# Patient Record
Sex: Female | Born: 1955 | Race: Black or African American | Hispanic: No | Marital: Single | Smoking: Former smoker
Health system: Southern US, Community
[De-identification: ages and names within clinical notes are randomized; demographics above are authoritative.]

## PROBLEM LIST (undated history)

## (undated) DIAGNOSIS — G4730 Sleep apnea, unspecified: Secondary | ICD-10-CM

## (undated) DIAGNOSIS — E119 Type 2 diabetes mellitus without complications: Secondary | ICD-10-CM

## (undated) DIAGNOSIS — K219 Gastro-esophageal reflux disease without esophagitis: Secondary | ICD-10-CM

## (undated) DIAGNOSIS — R921 Mammographic calcification found on diagnostic imaging of breast: Secondary | ICD-10-CM

## (undated) DIAGNOSIS — N189 Chronic kidney disease, unspecified: Secondary | ICD-10-CM

## (undated) DIAGNOSIS — I10 Essential (primary) hypertension: Secondary | ICD-10-CM

## (undated) DIAGNOSIS — T7840XA Allergy, unspecified, initial encounter: Secondary | ICD-10-CM

## (undated) DIAGNOSIS — E785 Hyperlipidemia, unspecified: Secondary | ICD-10-CM

## (undated) DIAGNOSIS — R519 Headache, unspecified: Secondary | ICD-10-CM

## (undated) DIAGNOSIS — R51 Headache: Secondary | ICD-10-CM

## (undated) HISTORY — DX: Sleep apnea, unspecified: G47.30

## (undated) HISTORY — PX: OTHER SURGICAL HISTORY: SHX169

## (undated) HISTORY — DX: Allergy, unspecified, initial encounter: T78.40XA

## (undated) HISTORY — DX: Chronic kidney disease, unspecified: N18.9

## (undated) HISTORY — DX: Headache: R51

## (undated) HISTORY — DX: Type 2 diabetes mellitus without complications: E11.9

## (undated) HISTORY — PX: ENDOMETRIAL ABLATION: SHX621

## (undated) HISTORY — DX: Headache, unspecified: R51.9

## (undated) HISTORY — PX: COLONOSCOPY: SHX174

## (undated) HISTORY — DX: Hyperlipidemia, unspecified: E78.5

## (undated) HISTORY — PX: TUBAL LIGATION: SHX77

## (undated) HISTORY — PX: BREAST SURGERY: SHX581

---

## 1999-07-07 ENCOUNTER — Other Ambulatory Visit: Admission: RE | Admit: 1999-07-07 | Discharge: 1999-07-07 | Payer: Self-pay | Admitting: Family Medicine

## 2001-03-22 ENCOUNTER — Other Ambulatory Visit: Admission: RE | Admit: 2001-03-22 | Discharge: 2001-03-22 | Payer: Self-pay | Admitting: Family Medicine

## 2002-09-19 ENCOUNTER — Other Ambulatory Visit: Admission: RE | Admit: 2002-09-19 | Discharge: 2002-09-19 | Payer: Self-pay | Admitting: Family Medicine

## 2006-07-22 ENCOUNTER — Ambulatory Visit: Payer: Self-pay | Admitting: Family Medicine

## 2006-07-23 ENCOUNTER — Ambulatory Visit: Payer: Self-pay | Admitting: Family Medicine

## 2006-07-23 LAB — CONVERTED CEMR LAB
Albumin: 3.2 g/dL — ABNORMAL LOW (ref 3.5–5.2)
Basophils Absolute: 0.1 10*3/uL (ref 0.0–0.1)
Bilirubin, Direct: 0.1 mg/dL (ref 0.0–0.3)
Cholesterol: 183 mg/dL (ref 0–200)
Eosinophils Absolute: 0.2 10*3/uL (ref 0.0–0.6)
GFR calc non Af Amer: 62 mL/min
Glucose, Bld: 117 mg/dL — ABNORMAL HIGH (ref 70–99)
HCT: 27.4 % — ABNORMAL LOW (ref 36.0–46.0)
Hemoglobin: 8.6 g/dL — ABNORMAL LOW (ref 12.0–15.0)
Hgb A1c MFr Bld: 6.7 % — ABNORMAL HIGH (ref 4.6–6.0)
Lymphocytes Relative: 22.8 % (ref 12.0–46.0)
MCHC: 31.2 g/dL (ref 30.0–36.0)
MCV: 62.8 fL — ABNORMAL LOW (ref 78.0–100.0)
Monocytes Absolute: 0.3 10*3/uL (ref 0.2–0.7)
Neutrophils Relative %: 69.3 % (ref 43.0–77.0)
Potassium: 3 meq/L — ABNORMAL LOW (ref 3.5–5.1)
Sodium: 137 meq/L (ref 135–145)
TSH: 1.86 microintl units/mL (ref 0.35–5.50)
Total Bilirubin: 0.4 mg/dL (ref 0.3–1.2)

## 2006-07-27 ENCOUNTER — Other Ambulatory Visit: Admission: RE | Admit: 2006-07-27 | Discharge: 2006-07-27 | Payer: Self-pay | Admitting: Family Medicine

## 2006-07-27 ENCOUNTER — Ambulatory Visit: Payer: Self-pay | Admitting: Family Medicine

## 2006-07-27 ENCOUNTER — Encounter: Payer: Self-pay | Admitting: Family Medicine

## 2006-09-14 ENCOUNTER — Ambulatory Visit: Payer: Self-pay | Admitting: Family Medicine

## 2006-10-14 ENCOUNTER — Ambulatory Visit: Payer: Self-pay | Admitting: Family Medicine

## 2006-12-20 DIAGNOSIS — I10 Essential (primary) hypertension: Secondary | ICD-10-CM

## 2007-01-10 ENCOUNTER — Ambulatory Visit: Payer: Self-pay | Admitting: Family Medicine

## 2007-01-10 DIAGNOSIS — D509 Iron deficiency anemia, unspecified: Secondary | ICD-10-CM | POA: Insufficient documentation

## 2007-01-10 DIAGNOSIS — N949 Unspecified condition associated with female genital organs and menstrual cycle: Secondary | ICD-10-CM

## 2007-03-25 ENCOUNTER — Ambulatory Visit: Payer: Self-pay | Admitting: Family Medicine

## 2007-03-25 LAB — CONVERTED CEMR LAB
Eosinophils Absolute: 0.2 10*3/uL (ref 0.0–0.6)
Eosinophils Relative: 3.1 % (ref 0.0–5.0)
Folate: 6.6 ng/mL
Hemoglobin: 10.6 g/dL
Iron: 102 ug/dL (ref 42–145)
MCV: 83.5 fL (ref 78.0–100.0)
Monocytes Relative: 6.6 % (ref 3.0–11.0)
Neutro Abs: 3.4 10*3/uL (ref 1.4–7.7)
Platelets: 285 10*3/uL (ref 150–400)
RBC: 4.45 M/uL (ref 3.87–5.11)
Vitamin B-12: 408 pg/mL (ref 211–911)
WBC: 5.8 10*3/uL (ref 4.5–10.5)

## 2008-12-03 ENCOUNTER — Ambulatory Visit: Payer: Self-pay | Admitting: Gastroenterology

## 2008-12-21 ENCOUNTER — Ambulatory Visit: Payer: Self-pay | Admitting: Gastroenterology

## 2012-02-18 ENCOUNTER — Encounter: Payer: Self-pay | Admitting: Family Medicine

## 2012-02-18 DIAGNOSIS — N189 Chronic kidney disease, unspecified: Secondary | ICD-10-CM | POA: Insufficient documentation

## 2012-09-26 ENCOUNTER — Other Ambulatory Visit: Payer: Self-pay | Admitting: Dermatology

## 2013-09-20 ENCOUNTER — Encounter: Payer: Self-pay | Admitting: Family Medicine

## 2013-09-20 ENCOUNTER — Ambulatory Visit (INDEPENDENT_AMBULATORY_CARE_PROVIDER_SITE_OTHER): Payer: 59 | Admitting: Family Medicine

## 2013-09-20 VITALS — BP 128/64 | HR 73 | Temp 98.3°F | Resp 16 | Ht 63.25 in | Wt 184.0 lb

## 2013-09-20 DIAGNOSIS — I10 Essential (primary) hypertension: Secondary | ICD-10-CM

## 2013-09-20 DIAGNOSIS — N898 Other specified noninflammatory disorders of vagina: Secondary | ICD-10-CM

## 2013-09-20 DIAGNOSIS — G4700 Insomnia, unspecified: Secondary | ICD-10-CM

## 2013-09-20 LAB — POCT URINALYSIS DIPSTICK
BILIRUBIN UA: NEGATIVE
GLUCOSE UA: 100
Ketones, UA: NEGATIVE
Nitrite, UA: NEGATIVE
Protein, UA: NEGATIVE
SPEC GRAV UA: 1.01
Urobilinogen, UA: 0.2
pH, UA: 5.5

## 2013-09-20 LAB — POCT UA - MICROSCOPIC ONLY
Casts, Ur, LPF, POC: NEGATIVE
Crystals, Ur, HPF, POC: NEGATIVE
MUCUS UA: NEGATIVE
Yeast, UA: NEGATIVE

## 2013-09-20 LAB — POCT WET PREP WITH KOH
KOH Prep POC: POSITIVE
RBC Wet Prep HPF POC: NEGATIVE
TRICHOMONAS UA: NEGATIVE
Yeast Wet Prep HPF POC: NEGATIVE

## 2013-09-20 MED ORDER — TRAZODONE HCL 50 MG PO TABS: 25.0000 mg | ORAL_TABLET | Freq: Every evening | ORAL | Status: DC | PRN

## 2013-09-20 MED ORDER — METRONIDAZOLE 500 MG PO TABS: 500.0000 mg | ORAL_TABLET | Freq: Two times a day (BID) | ORAL | Status: DC

## 2013-09-20 MED ORDER — FLUCONAZOLE 150 MG PO TABS: 150.0000 mg | ORAL_TABLET | Freq: Once | ORAL | Status: DC

## 2013-09-20 NOTE — Progress Notes (Addendum)
Subjective:    Patient ID: Jane Pena, female    DOB: 25-Sep-1955, 58 y.o.   MRN: [id number]  HPI  This 58 y.o. AA female is new to [facility], last seen here > 3 years ago. She has HTN, followed by [name] annually at [facility]. She reports occasional lightheadedness when she gets up too fast. She has no other symptoms and exercises regularly.  Pt c/o insomnia for several years. She has no trouble going to sleep but awakens after 3-4 hours and cannot esily return to sleep. Ambien has adverse effects. She has not tried any OTC products. Limits caffeine intake (coffee) to morning hours. She lives w/ an elderly female. Bedtime routine: crocheting, reading or TV. Temperature in bedroom is comfortable.  Pt c/o vag discharge that itches; she has not noticed an odor. She is sexually active x 3 months after abstinence for > 1 year. GYN exam is due next months (Dr. Matthew Saras).  Patient Active Problem List   Diagnosis Date Noted  . Chronic kidney disease 02/18/2012  . ANEMIA-IRON DEFICIENCY 01/10/2007  . 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 HYPERTENSION 12/20/2006   PMHx, Surg Hx, Soc Hx and Fam Hx reviewed.   Prescription Medication: Amlodipine- valsartan 10-320 MG tablet  1 tab daily.   Review of Systems  Constitutional: Negative.   Eyes: Negative for visual disturbance.  Respiratory: Negative for cough, chest tightness and shortness of breath.   Cardiovascular: Negative for chest pain, palpitations and leg swelling.  Endocrine: Negative.   Genitourinary: Negative for difficulty urinating, genital sores, vaginal pain, pelvic pain and dyspareunia.  Neurological: Negative.   Psychiatric/Behavioral: Positive for sleep disturbance. Negative for confusion, dysphoric mood and agitation. The patient is not nervous/anxious.        Objective:   Physical Exam  Nursing note and vitals reviewed. Constitutional: She is oriented to person, place, and time. She appears  well-developed and well-nourished. No distress.  HENT:  Head: Normocephalic and atraumatic.  Right Ear: External ear normal.  Left Ear: External ear normal.  Nose: Nose normal.  Mouth/Throat: Oropharynx is clear and moist. No oropharyngeal exudate.  Eyes: Conjunctivae and EOM are normal. Pupils are equal, round, and reactive to light. No scleral icterus.  Neck: Normal range of motion. Neck supple.  Cardiovascular: Normal rate, regular rhythm and normal heart sounds.  Exam reveals no gallop and no friction rub.   No murmur heard. Pulmonary/Chest: Effort normal and breath sounds normal. No respiratory distress.  Abdominal: Soft. She exhibits no distension and no mass. There is no tenderness. There is no guarding.  Genitourinary: There is no rash, tenderness or lesion on the right labia. There is no rash, tenderness or lesion on the left labia. Cervix exhibits discharge. Cervix exhibits no friability. There is erythema around the vagina. No tenderness or bleeding around the vagina. Vaginal discharge found.  Musculoskeletal: Normal range of motion. She exhibits no edema and no tenderness.  Neurological: She is alert and oriented to person, place, and time. No cranial nerve deficit. Coordination normal.  Skin: Skin is warm and dry. She is not diaphoretic.  Psychiatric: She has a normal mood and affect. Her behavior is normal. Judgment and thought content normal.    Results for orders placed in visit on 09/20/13  POCT UA - MICROSCOPIC ONLY      Result Value Ref Range   WBC, Ur, HPF, POC tntc     RBC, urine, microscopic 5-7     Bacteria, U Microscopic 2+  Mucus, UA neg     Epithelial cells, urine per micros 3-6     Crystals, Ur, HPF, POC neg     Casts, Ur, LPF, POC neg     Yeast, UA neg    POCT URINALYSIS DIPSTICK      Result Value Ref Range   Color, UA yellow     Clarity, UA clear     Glucose, UA 100     Bilirubin, UA neg     Ketones, UA neg     Spec Grav, UA 1.010     Blood, UA  trace     pH, UA 5.5     Protein, UA neg     Urobilinogen, UA 0.2     Nitrite, UA neg     Leukocytes, UA moderate (2+)    POCT WET PREP WITH KOH      Result Value Ref Range   Trichomonas, UA Negative     Clue Cells Wet Prep HPF POC tntc     Epithelial Wet Prep HPF POC tntc     Yeast Wet Prep HPF POC neg     Bacteria Wet Prep HPF POC 4+     RBC Wet Prep HPF POC neg     WBC Wet Prep HPF POC tntc     KOH Prep POC Positive         Assessment & Plan:  Discharge of vagina - Plan: POCT UA - Microscopic Only, POCT urinalysis dipstick, POCT Wet Prep with KOH  Insomnia - Trial Trazodone 50 mg 1/2 tablet hs for 2 weeks; if not sleeping through the night, increase to 1 tablet hs.          Plan: Thyroid Panel With TSH  HTN (hypertension) - Plan: Thyroid Panel With TSH, Vitamin D, 25-hydroxy  Meds ordered this encounter  Medications  . traZODone (DESYREL) 50 MG tablet    Sig: Take 0.5-1 tablets (25-50 mg total) by mouth at bedtime as needed for sleep.    Dispense:  30 tablet    Refill:  3  . metroNIDAZOLE (FLAGYL) 500 MG tablet    Sig: Take 1 tablet (500 mg total) by mouth 2 (two) times daily.    Dispense:  14 tablet    Refill:  0  . fluconazole (DIFLUCAN) 150 MG tablet    Sig: Take 1 tablet (150 mg total) by mouth once.    Dispense:  1 tablet    Refill:  0

## 2013-09-20 NOTE — Patient Instructions (Addendum)
Insomnia Insomnia is frequent trouble falling and/or staying asleep. Insomnia can be a long term problem or a short term problem. Both are common. Insomnia can be a short term problem when the wakefulness is related to a certain stress or worry. Long term insomnia is often related to ongoing stress during waking hours and/or poor sleeping habits. Overtime, sleep deprivation itself can make the problem worse. Every little thing feels more severe because you are overtired and your ability to cope is decreased. CAUSES   Stress, anxiety, and depression.  Poor sleeping habits.  Distractions such as TV in the bedroom.  Naps close to bedtime.  Engaging in emotionally charged conversations before bed.  Technical reading before sleep.  Alcohol and other sedatives. They may make the problem worse. They can hurt normal sleep patterns and normal dream activity.  Stimulants such as caffeine for several hours prior to bedtime.  Pain syndromes and shortness of breath can cause insomnia.  Exercise late at night.  Changing time zones may cause sleeping problems (jet lag). It is sometimes helpful to have someone observe your sleeping patterns. They should look for periods of not breathing during the night (sleep apnea). They should also look to see how long those periods last. If you live alone or observers are uncertain, you can also be observed at a sleep clinic where your sleep patterns will be professionally monitored. Sleep apnea requires a checkup and treatment. Give your caregivers your medical history. Give your caregivers observations your family has made about your sleep.  SYMPTOMS   Not feeling rested in the morning.  Anxiety and restlessness at bedtime.  Difficulty falling and staying asleep. TREATMENT   Your caregiver may prescribe treatment for an underlying medical disorders. Your caregiver can give advice or help if you are using alcohol or other drugs for self-medication. Treatment  of underlying problems will usually eliminate insomnia problems.  Medications can be prescribed for short time use. They are generally not recommended for lengthy use.  Over-the-counter sleep medicines are not recommended for lengthy use. They can be habit forming.  You can promote easier sleeping by making lifestyle changes such as:  Using relaxation techniques that help with breathing and reduce muscle tension.  Exercising earlier in the day.  Changing your diet and the time of your last meal. No night time snacks.  Establish a regular time to go to bed.  Counseling can help with stressful problems and worry.  Soothing music and white noise may be helpful if there are background noises you cannot remove.  Stop tedious detailed work at least one hour before bedtime. HOME CARE INSTRUCTIONS   Keep a diary. Inform your caregiver about your progress. This includes any medication side effects. See your caregiver regularly. Take note of:  Times when you are asleep.  Times when you are awake during the night.  The quality of your sleep.  How you feel the next day. This information will help your caregiver care for you.  Get out of bed if you are still awake after 15 minutes. Read or do some quiet activity. Keep the lights down. Wait until you feel sleepy and go back to bed.  Keep regular sleeping and waking hours. Avoid naps.  Exercise regularly.  Avoid distractions at bedtime. Distractions include watching television or engaging in any intense or detailed activity like attempting to balance the household checkbook.  Develop a bedtime ritual. Keep a familiar routine of bathing, brushing your teeth, climbing into bed at the same   time each night, listening to soothing music. Routines increase the success of falling to sleep faster.  Use relaxation techniques. This can be using breathing and muscle tension release routines. It can also include visualizing peaceful scenes. You can  also help control troubling or intruding thoughts by keeping your mind occupied with boring or repetitive thoughts like the old concept of counting sheep. You can make it more creative like imagining planting one beautiful flower after another in your backyard garden.  During your day, work to eliminate stress. When this is not possible use some of the previous suggestions to help reduce the anxiety that accompanies stressful situations. MAKE SURE YOU:   Understand these instructions.  Will watch your condition.  Will get help right away if you are not doing well or get worse. Document Released: 05/01/2000 Document Revised: 07/27/2011 Document Reviewed: 06/01/2007 [facility] Patient Information 2014 [place].  I have prescribed Trazodone 50 mg tablet for sleep; take 1/2 tablet at bedtime for 1 week then increase to 1 tablet if you are not sleeping through the night.    Achilles Tendinitis Achilles tendinitis is inflammation of the tough, cord-like band that attaches the lower muscles of your leg to your heel (Achilles tendon). It is usually caused by overusing the tendon and joint involved.  CAUSES Achilles tendinitis can happen because of:  A sudden increase in exercise or activity (such as running).  Doing the same exercises or activities (such as jumping) over and over.  Not warming up calf muscles before exercising.  Exercising in shoes that are worn out or not made for exercise.  Having arthritis or a bone growth on the back of the heel bone. This can rub against the tendon and hurt the tendon. SIGNS AND SYMPTOMS The most common symptoms are:  Pain in the back of the leg, just above the heel. The pain usually gets worse with exercise and better with rest.  Stiffness or soreness in the back of the leg, especially in the morning.  Swelling of the skin over the Achilles tendon.  Trouble standing on tiptoe. Sometimes, an Achilles tendon tears (ruptures). Symptoms of an  Achilles tendon rupture can include:  Sudden, severe pain in the back of the leg.  Trouble putting weight on the foot or walking normally. DIAGNOSIS Achilles tendinitis will be diagnosed based on symptoms and a physical examination. An X-ray may be done to check if another condition is causing your symptoms. An MRI may be ordered if your health care provider suspects you may have completely torn your tendon, which is called an Achilles tendon rupture.  TREATMENT  Achilles tendinitis usually gets better over time. It can take weeks to months to heal completely. Treatment focuses on treating the symptoms and helping the injury heal. HOME CARE INSTRUCTIONS   Rest your Achilles tendon and avoid activities that cause pain.  Apply ice to the injured area:  Put ice in a plastic bag.  Place a towel between your skin and the bag.  Leave the ice on for 20 minutes, 2 3 times a day  Try to avoid using the tendon (other than gentle range of motion) while the tendon is painful. Do not resume use until instructed by your health care provider. Then begin use gradually. Do not increase use to the point of pain. If pain does develop, decrease use and continue the above measures. Gradually increase activities that do not cause discomfort until you achieve normal use.  Do exercises to make your calf  muscles stronger and more flexible. Your health care provider or physical therapist can recommend exercises for you to do.  Wrap your ankle with an elastic bandage or other wrap. This can help keep your tendon from moving too much. Your health care provider will show you how to wrap your ankle correctly.  Only take over-the-counter or prescription medicines for pain, discomfort, or fever as directed by your health care provider. SEEK MEDICAL CARE IF:   Your pain and swelling increase or pain is uncontrolled with medicines.  You develop new, unexplained symptoms or your symptoms get worse.  You are unable to  move your toes or foot.  You develop warmth and swelling in your foot.  You have an unexplained temperature. MAKE SURE YOU:   Understand these instructions.  Will watch your condition.  Will get help right away if you are not doing well or get worse. Document Released: 02/11/2005 Document Revised: 02/22/2013 Document Reviewed: 12/14/2012 [facility] Patient Information 2014 [place].    Bacterial Vaginosis Bacterial vaginosis is a vaginal infection that occurs when the normal balance of bacteria in the vagina is disrupted. It results from an overgrowth of certain bacteria. This is the most common vaginal infection in women of childbearing age. Treatment is important to prevent complications, especially in pregnant women, as it can cause a premature delivery. CAUSES  Bacterial vaginosis is caused by an increase in harmful bacteria that are normally present in smaller amounts in the vagina. Several different kinds of bacteria can cause bacterial vaginosis. However, the reason that the condition develops is not fully understood. RISK FACTORS Certain activities or behaviors can put you at an increased risk of developing bacterial vaginosis, including:  Having a new sex partner or multiple sex partners.  Douching.  Using an intrauterine device (IUD) for contraception. Women do not get bacterial vaginosis from toilet seats, bedding, swimming pools, or contact with objects around them. SIGNS AND SYMPTOMS  Some women with bacterial vaginosis have no signs or symptoms. Common symptoms include:  Grey vaginal discharge.  A fishlike odor with discharge, especially after sexual intercourse.  Itching or burning of the vagina and vulva.  Burning or pain with urination. DIAGNOSIS  Your health care provider will take a medical history and examine the vagina for signs of bacterial vaginosis. A sample of vaginal fluid may be taken. Your health care provider will look at this sample under a  microscope to check for bacteria and abnormal cells. A vaginal pH test may also be done.  TREATMENT  Bacterial vaginosis may be treated with antibiotic medicines. These may be given in the form of a pill or a vaginal cream. A second round of antibiotics may be prescribed if the condition comes back after treatment.  HOME CARE INSTRUCTIONS   Only take over-the-counter or prescription medicines as directed by your health care provider.  If antibiotic medicine was prescribed, take it as directed. Make sure you finish it even if you start to feel better.  Do not have sex until treatment is completed.  Tell all sexual partners that you have a vaginal infection. They should see their health care provider and be treated if they have problems, such as a mild rash or itching.  Practice safe sex by using condoms and only having one sex partner. SEEK MEDICAL CARE IF:   Your symptoms are not improving after 3 days of treatment.  You have increased discharge or pain.  You have a fever. MAKE SURE YOU:   Understand these  instructions.  Will watch your condition.  Will get help right away if you are not doing well or get worse. FOR MORE INFORMATION  Centers for Disease Control and Prevention, Division of STD Prevention: [employer] American Sexual Health Association (ASHA): www.ashastd.org  Document Released: 05/04/2005 Document Revised: 02/22/2013 Document Reviewed: 12/14/2012 [facility] Patient Information 2014 [place].   Medications to clear this discharge- First take Metronidazole twice a day for 1 week then take the Diflucan tablet to clear the yeast infectin.

## 2013-09-21 LAB — THYROID PANEL WITH TSH
FREE THYROXINE INDEX: 1.5 (ref 1.2–4.9)
T3 Uptake Ratio: 26 % (ref 24–39)
T4 TOTAL: 5.6 ug/dL (ref 4.5–12.0)
TSH: 2.09 u[IU]/mL (ref 0.450–4.500)

## 2013-09-21 LAB — VITAMIN D 25 HYDROXY (VIT D DEFICIENCY, FRACTURES): Vit D, 25-Hydroxy: 14.2 ng/mL — ABNORMAL LOW (ref 30.0–100.0)

## 2013-09-24 ENCOUNTER — Other Ambulatory Visit: Payer: Self-pay | Admitting: Family Medicine

## 2013-09-24 MED ORDER — ERGOCALCIFEROL 1.25 MG (50000 UT) PO CAPS: 50000.0000 [IU] | ORAL_CAPSULE | ORAL | Status: AC

## 2013-09-24 NOTE — Progress Notes (Signed)
Quick Note:  Please advise pt regarding following labs... Vitamin D level is very low. I am prescribing a Vitamin D 50000 units per capsule; take 1 capsule once a week. Try to eat more Vitamin D- rich foods (salmon, tuna, sardines, mushrooms, eggs and some dairy) to increase this vitamin in your diet. Also, try to get 10-15 minutes of sun exposure most days of the week. It will take several months to get your Vitamin D level up to normal.  Thyroid gland function is normal.  Copy to pt. ______

## 2013-09-26 ENCOUNTER — Telehealth: Payer: Self-pay

## 2013-09-26 NOTE — Telephone Encounter (Signed)
Patient called for lab results.  Advised patient of low vitamin D level and that we were prescribing a vitamin D capsule for her to take.  Advised her to eat more vitamin D enriched foods and get more sunlight.  She said she would do so.  Also told patient that her thyroid was normal.

## 2013-10-06 ENCOUNTER — Encounter: Payer: Self-pay | Admitting: Radiology

## 2013-11-01 ENCOUNTER — Other Ambulatory Visit: Payer: Self-pay

## 2013-11-01 MED ORDER — TRAZODONE HCL 50 MG PO TABS: 25.0000 mg | ORAL_TABLET | Freq: Every evening | ORAL | Status: DC | PRN

## 2013-11-18 ENCOUNTER — Encounter (HOSPITAL_COMMUNITY): Payer: Self-pay | Admitting: Emergency Medicine

## 2013-11-18 ENCOUNTER — Emergency Department (HOSPITAL_COMMUNITY)
Admission: EM | Admit: 2013-11-18 | Discharge: 2013-11-18 | Disposition: A | Discharge status: Other Acute Inpatient Hospital | Payer: 59 | Attending: Emergency Medicine | Admitting: Emergency Medicine

## 2013-11-18 DIAGNOSIS — IMO0002 Reserved for concepts with insufficient information to code with codable children: Secondary | ICD-10-CM

## 2013-11-18 DIAGNOSIS — T25239A Burn of second degree of unspecified toe(s) (nail), initial encounter: Secondary | ICD-10-CM | POA: Insufficient documentation

## 2013-11-18 DIAGNOSIS — I10 Essential (primary) hypertension: Secondary | ICD-10-CM | POA: Insufficient documentation

## 2013-11-18 DIAGNOSIS — Z23 Encounter for immunization: Secondary | ICD-10-CM | POA: Insufficient documentation

## 2013-11-18 DIAGNOSIS — E876 Hypokalemia: Secondary | ICD-10-CM | POA: Insufficient documentation

## 2013-11-18 DIAGNOSIS — Z79899 Other long term (current) drug therapy: Secondary | ICD-10-CM | POA: Insufficient documentation

## 2013-11-18 DIAGNOSIS — X12XXXA Contact with other hot fluids, initial encounter: Secondary | ICD-10-CM | POA: Insufficient documentation

## 2013-11-18 DIAGNOSIS — T24209A Burn of second degree of unspecified site of unspecified lower limb, except ankle and foot, initial encounter: Secondary | ICD-10-CM | POA: Insufficient documentation

## 2013-11-18 DIAGNOSIS — X131XXA Other contact with steam and other hot vapors, initial encounter: Secondary | ICD-10-CM

## 2013-11-18 DIAGNOSIS — R748 Abnormal levels of other serum enzymes: Secondary | ICD-10-CM | POA: Insufficient documentation

## 2013-11-18 DIAGNOSIS — Y92009 Unspecified place in unspecified non-institutional (private) residence as the place of occurrence of the external cause: Secondary | ICD-10-CM | POA: Insufficient documentation

## 2013-11-18 DIAGNOSIS — Y93G3 Activity, cooking and baking: Secondary | ICD-10-CM | POA: Insufficient documentation

## 2013-11-18 DIAGNOSIS — Z87891 Personal history of nicotine dependence: Secondary | ICD-10-CM | POA: Insufficient documentation

## 2013-11-18 DIAGNOSIS — T25229A Burn of second degree of unspecified foot, initial encounter: Secondary | ICD-10-CM | POA: Insufficient documentation

## 2013-11-18 HISTORY — DX: Essential (primary) hypertension: I10

## 2013-11-18 LAB — COMPREHENSIVE METABOLIC PANEL
ALBUMIN: 4.2 g/dL (ref 3.5–5.2)
ALK PHOS: 73 U/L (ref 39–117)
ALT: 22 U/L (ref 0–35)
AST: 25 U/L (ref 0–37)
Anion gap: 17 — ABNORMAL HIGH (ref 5–15)
BUN: 17 mg/dL (ref 6–23)
CHLORIDE: 95 meq/L — AB (ref 96–112)
CO2: 25 meq/L (ref 19–32)
CREATININE: 1.23 mg/dL — AB (ref 0.50–1.10)
Calcium: 9.8 mg/dL (ref 8.4–10.5)
GFR calc Af Amer: 54 mL/min — ABNORMAL LOW (ref >90)
GFR, EST NON AFRICAN AMERICAN: 47 mL/min — AB (ref >90)
Glucose, Bld: 154 mg/dL — ABNORMAL HIGH (ref 70–99)
Potassium: 3.5 mEq/L — ABNORMAL LOW (ref 3.7–5.3)
SODIUM: 137 meq/L (ref 137–147)
Total Bilirubin: 0.3 mg/dL (ref 0.3–1.2)
Total Protein: 7.7 g/dL (ref 6.0–8.3)

## 2013-11-18 LAB — CBC WITH DIFFERENTIAL/PLATELET
BASOS PCT: 0 % (ref 0–1)
Basophils Absolute: 0 10*3/uL (ref 0.0–0.1)
Eosinophils Absolute: 0.1 10*3/uL (ref 0.0–0.7)
Eosinophils Relative: 1 % (ref 0–5)
HEMATOCRIT: 39.9 % (ref 36.0–46.0)
Hemoglobin: 13.8 g/dL (ref 12.0–15.0)
LYMPHS PCT: 18 % (ref 12–46)
Lymphs Abs: 1.8 10*3/uL (ref 0.7–4.0)
MCH: 30.9 pg (ref 26.0–34.0)
MCHC: 34.6 g/dL (ref 30.0–36.0)
MCV: 89.5 fL (ref 78.0–100.0)
MONO ABS: 0.4 10*3/uL (ref 0.1–1.0)
Monocytes Relative: 4 % (ref 3–12)
NEUTROS ABS: 8.1 10*3/uL — AB (ref 1.7–7.7)
Neutrophils Relative %: 77 % (ref 43–77)
Platelets: 217 10*3/uL (ref 150–400)
RBC: 4.46 MIL/uL (ref 3.87–5.11)
RDW: 12.9 % (ref 11.5–15.5)
WBC: 10.4 10*3/uL (ref 4.0–10.5)

## 2013-11-18 MED ORDER — SILVER SULFADIAZINE 1 % EX CREA
TOPICAL_CREAM | Freq: Once | CUTANEOUS | Status: AC
  Administered 2013-11-18: 20:00:00 via TOPICAL
  Filled 2013-11-18: qty 85

## 2013-11-18 MED ORDER — TETANUS-DIPHTH-ACELL PERTUSSIS 5-2.5-18.5 LF-MCG/0.5 IM SUSP
0.5000 mL | Freq: Once | INTRAMUSCULAR | Status: AC
  Administered 2013-11-18: 0.5 mL via INTRAMUSCULAR
  Filled 2013-11-18: qty 0.5

## 2013-11-18 MED ORDER — SODIUM CHLORIDE 0.9 % IV BOLUS (SEPSIS)
1000.0000 mL | Freq: Once | INTRAVENOUS | Status: AC
  Administered 2013-11-18: 1000 mL via INTRAVENOUS

## 2013-11-18 NOTE — ED Notes (Signed)
Pt states that she would like something else for pain. [place], PA is aware.
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
## 2013-11-18 NOTE — ED Notes (Signed)
IV team paged for IV start. 
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
## 2013-11-20 DIAGNOSIS — G8911 Acute pain due to trauma: Secondary | ICD-10-CM | POA: Insufficient documentation

## 2013-11-20 DIAGNOSIS — R6510 Systemic inflammatory response syndrome (SIRS) of non-infectious origin without acute organ dysfunction: Secondary | ICD-10-CM | POA: Insufficient documentation

## 2013-12-20 DIAGNOSIS — T310 Burns involving less than 10% of body surface: Secondary | ICD-10-CM | POA: Insufficient documentation

## 2013-12-25 DIAGNOSIS — D62 Acute posthemorrhagic anemia: Secondary | ICD-10-CM | POA: Insufficient documentation

## 2013-12-30 ENCOUNTER — Other Ambulatory Visit: Payer: Self-pay | Admitting: Family Medicine

## 2014-01-09 DIAGNOSIS — L299 Pruritus, unspecified: Secondary | ICD-10-CM | POA: Insufficient documentation

## 2014-01-10 ENCOUNTER — Encounter: Payer: Self-pay | Admitting: Family Medicine

## 2014-01-10 ENCOUNTER — Ambulatory Visit (INDEPENDENT_AMBULATORY_CARE_PROVIDER_SITE_OTHER): Payer: 59 | Admitting: Family Medicine

## 2014-01-10 VITALS — BP 104/70 | HR 88 | Temp 98.5°F | Resp 16 | Ht 63.25 in | Wt 172.8 lb

## 2014-01-10 DIAGNOSIS — Z13220 Encounter for screening for lipoid disorders: Secondary | ICD-10-CM

## 2014-01-10 DIAGNOSIS — Z131 Encounter for screening for diabetes mellitus: Secondary | ICD-10-CM

## 2014-01-10 DIAGNOSIS — I10 Essential (primary) hypertension: Secondary | ICD-10-CM

## 2014-01-10 DIAGNOSIS — Z139 Encounter for screening, unspecified: Secondary | ICD-10-CM

## 2014-01-10 LAB — POCT GLYCOSYLATED HEMOGLOBIN (HGB A1C): HEMOGLOBIN A1C: 5.8
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
## 2014-01-11 LAB — COMPREHENSIVE METABOLIC PANEL
ALT: 17 IU/L (ref 0–32)
AST: 17 IU/L (ref 0–40)
Albumin/Globulin Ratio: 1.3 (ref 1.1–2.5)
Albumin: 4.2 g/dL (ref 3.6–4.8)
Alkaline Phosphatase: 85 IU/L (ref 39–117)
BILIRUBIN TOTAL: 0.2 mg/dL (ref 0.0–1.2)
BUN/Creatinine Ratio: 16 (ref 11–26)
BUN: 18 mg/dL (ref 8–27)
CALCIUM: 10.1 mg/dL (ref 8.7–10.3)
CO2: 27 mmol/L (ref 18–29)
CREATININE: 1.16 mg/dL — AB (ref 0.57–1.00)
Chloride: 99 mmol/L (ref 97–108)
GFR calc Af Amer: 59 mL/min/{1.73_m2} — ABNORMAL LOW (ref >59)
GFR, EST NON AFRICAN AMERICAN: 51 mL/min/{1.73_m2} — AB (ref >59)
GLOBULIN, TOTAL: 3.2 g/dL (ref 1.5–4.5)
Glucose: 111 mg/dL — ABNORMAL HIGH (ref 65–99)
Potassium: 4.5 mmol/L (ref 3.5–5.2)
Sodium: 142 mmol/L (ref 134–144)
Total Protein: 7.4 g/dL (ref 6.0–8.5)

## 2014-01-11 LAB — LIPID PANEL
CHOL/HDL RATIO: 4.7 ratio — AB (ref 0.0–4.4)
CHOLESTEROL TOTAL: 242 mg/dL — AB (ref 100–199)
HDL: 52 mg/dL (ref >39)
LDL Calculated: 174 mg/dL — ABNORMAL HIGH (ref 0–99)
TRIGLYCERIDES: 80 mg/dL (ref 0–149)
VLDL CHOLESTEROL CAL: 16 mg/dL (ref 5–40)

## 2014-01-12 ENCOUNTER — Telehealth: Payer: Self-pay

## 2014-01-12 LAB — NICOTINE/COTININE METABOLITES
Cotinine: NOT DETECTED ng/mL
Nicotine: NOT DETECTED ng/mL

## 2014-01-12 NOTE — Telephone Encounter (Signed)
Pt states was seen by dr Leward Quan on 01/09/14, we were to fill out some forms regarding lab work and biometric information for her company  Pt wants to verify we have completed that information and fax the form as requested

## 2014-01-14 NOTE — Progress Notes (Signed)
Quick Note:  Please advise pt regarding following labs... Your form has been faxed to the appropriate number. Your blood sugar, kidney function values and cholesterol numbers are above normal.   Focus on improving nutrition and exercising on a regular basis. A "heart healthy" diet is very important. To help improve kidney function, maintain good hydration. At least half of your fluid intake should be water. Eat less protein; try having one day a week where you have no meat. Eat more fruits and vegetables. Your A1c= 5.8% which is borderline Diabetes.   These labs will need to be repeated at your next visit.  Copy to pt. ______

## 2014-01-15 NOTE — Telephone Encounter (Signed)
I have the form completed I am waiting on a signature. It is on Dr [employer] desk

## 2014-03-03 ENCOUNTER — Other Ambulatory Visit: Payer: Self-pay | Admitting: Family Medicine

## 2014-03-16 ENCOUNTER — Telehealth: Payer: Self-pay

## 2014-03-16 NOTE — Telephone Encounter (Signed)
Lm for rtn call 

## 2014-03-16 NOTE — Telephone Encounter (Signed)
Pt would like to speak with someone regarding the labs that she had done in August for her biometric screening .  Best# [phone]

## 2014-03-17 NOTE — Telephone Encounter (Signed)
Left message on machine to call back  

## 2014-03-20 NOTE — Telephone Encounter (Signed)
LM for rtn call. 

## 2014-04-01 ENCOUNTER — Telehealth: Payer: Self-pay | Admitting: *Deleted

## 2014-04-01 NOTE — Telephone Encounter (Signed)
Pt called requesting copies of recent labs for biometric screening for insurance. I faxed results to fax number that was provided in message [phone] per pt request.

## 2014-04-03 DIAGNOSIS — M792 Neuralgia and neuritis, unspecified: Secondary | ICD-10-CM | POA: Insufficient documentation

## 2014-04-03 DIAGNOSIS — L819 Disorder of pigmentation, unspecified: Secondary | ICD-10-CM | POA: Insufficient documentation

## 2014-06-01 ENCOUNTER — Other Ambulatory Visit: Payer: Self-pay | Admitting: Family Medicine

## 2014-06-01 NOTE — Telephone Encounter (Signed)
Dr Leward Quan, pt has appt sch for 07/11/14. Do you want to OK a 90 day RF to mail order?

## 2014-06-01 NOTE — Telephone Encounter (Signed)
Trazodone refill authorized for 90-day supply.

## 2014-07-11 ENCOUNTER — Encounter: Payer: Self-pay | Admitting: Family Medicine

## 2014-07-11 ENCOUNTER — Ambulatory Visit (INDEPENDENT_AMBULATORY_CARE_PROVIDER_SITE_OTHER): Payer: 59 | Admitting: Family Medicine

## 2014-07-11 VITALS — BP 130/82 | HR 74 | Temp 98.1°F | Resp 16 | Ht 63.25 in | Wt 181.6 lb

## 2014-07-11 DIAGNOSIS — E559 Vitamin D deficiency, unspecified: Secondary | ICD-10-CM

## 2014-07-11 DIAGNOSIS — R7302 Impaired glucose tolerance (oral): Secondary | ICD-10-CM

## 2014-07-11 DIAGNOSIS — R7309 Other abnormal glucose: Secondary | ICD-10-CM

## 2014-07-11 DIAGNOSIS — I10 Essential (primary) hypertension: Secondary | ICD-10-CM

## 2014-07-11 DIAGNOSIS — Z23 Encounter for immunization: Secondary | ICD-10-CM

## 2014-07-11 DIAGNOSIS — E7800 Pure hypercholesterolemia, unspecified: Secondary | ICD-10-CM

## 2014-07-11 DIAGNOSIS — Z0000 Encounter for general adult medical examination without abnormal findings: Secondary | ICD-10-CM

## 2014-07-11 LAB — POCT URINALYSIS DIPSTICK
Bilirubin, UA: NEGATIVE
GLUCOSE UA: NEGATIVE
Ketones, UA: NEGATIVE
Nitrite, UA: NEGATIVE
Protein, UA: NEGATIVE
SPEC GRAV UA: 1.02
Urobilinogen, UA: 0.2
pH, UA: 5.5

## 2014-07-11 LAB — POCT UA - MICROSCOPIC ONLY
Casts, Ur, LPF, POC: NEGATIVE
Crystals, Ur, HPF, POC: NEGATIVE
Mucus, UA: POSITIVE
YEAST UA: NEGATIVE

## 2014-07-11 MED ORDER — ZOSTER VACCINE LIVE 19400 UNT/0.65ML ~~LOC~~ SOLR: 0.6500 mL | Freq: Once | SUBCUTANEOUS | Status: DC

## 2014-07-11 NOTE — Patient Instructions (Signed)
Keeping You Healthy  Get These Tests  Blood Pressure- Have your blood pressure checked by your healthcare provider at least once a year.  Normal blood pressure is 120/80.  Weight- Have your body mass index (BMI) calculated to screen for obesity.  BMI is a measure of body fat based on height and weight.  You can calculate your own BMI at [employer]  Cholesterol- Have your cholesterol checked every year.  Diabetes- Have your blood sugar checked every year if you have high blood pressure, high cholesterol, a family history of diabetes or if you are overweight.  Pap Smear- Have a pap smear every 1 to 3 years if you have been sexually active.  If you are older than 65 and recent pap smears have been normal you may not need additional pap smears.  In addition, if you have had a hysterectomy  For benign disease additional pap smears are not necessary.  Mammogram-Yearly mammograms are essential for early detection of breast cancer  Screening for Colon Cancer- Colonoscopy starting at age 35. Screening may begin sooner depending on your family history and other health conditions.  Follow up colonoscopy as directed by your Gastroenterologist.  Screening for Osteoporosis- Screening begins at age 42 with bone density scanning, sooner if you are at higher risk for developing Osteoporosis.  Get these medicines  Calcium with Vitamin D- Your body requires 1200-1500 mg of Calcium a day and [phone] IU of Vitamin D a day.  You can only absorb 500 mg of Calcium at a time therefore Calcium must be taken in 2 or 3 separate doses throughout the day.  Hormones- Hormone therapy has been associated with increased risk for certain cancers and heart disease.  Talk to your healthcare provider about if you need relief from menopausal symptoms.  Aspirin- Ask your healthcare provider about taking Aspirin to prevent Heart Disease and Stroke.  Get these Immuniztions  Flu shot- Every fall  Pneumonia  shot- Once after the age of 60; if you are younger ask your healthcare provider if you need a pneumonia shot.  Tetanus- Every ten years.  Zostavax- Once after the age of 39 to prevent shingles. You received prescription today for this vaccine. Take it to [employer] [employer] at [facility] or [facility].  Take these steps  Don't smoke- Your healthcare provider can help you quit. For tips on how to quit, ask your healthcare provider or go to www.smokefree.gov or call 1-800 QUIT-NOW.  Be physically active- Exercise 5 days a week for a minimum of 30 minutes.  If you are not already physically active, start slow and gradually work up to 30 minutes of moderate physical activity.  Try walking, dancing, bike riding, swimming, etc.  Eat a healthy diet- Eat a variety of healthy foods such as fruits, vegetables, whole grains, low fat milk, low fat cheeses, yogurt, lean meats, chicken, fish, eggs, dried beans, tofu, etc.  For more information go to www.thenutritionsource.org  Dental visit- Brush and floss teeth twice daily; visit your dentist twice a year.  Eye exam- Visit your Optometrist or Ophthalmologist yearly.  Drink alcohol in moderation- Limit alcohol intake to one drink or less a day.  Never drink and drive.  Depression- Your emotional health is as important as your physical health.  If you're feeling down or losing interest in things you normally enjoy, please talk to your healthcare provider.  Seat Belts- can save your life; always wear one  Smoke/Carbon Monoxide detectors- These detectors need to be installed  on the appropriate level of your home.  Replace batteries at least once a year.  Violence- If anyone is threatening or hurting you, please tell your healthcare provider.  Living Will/ Health care power of attorney- Discuss with your healthcare provider and family.       Mediterranean Diet  Why follow it? Research shows. . Those who follow the Mediterranean diet have a  reduced risk of heart disease  . The diet is associated with a reduced incidence of Parkinson's and Alzheimer's diseases . People following the diet may have longer life expectancies and lower rates of chronic diseases  . The Dietary Guidelines for Americans recommends the Mediterranean diet as an eating plan to promote health and prevent disease  What Is the Mediterranean Diet?  . Healthy eating plan based on typical foods and recipes of Mediterranean-style cooking . The diet is primarily a plant based diet; these foods should make up a majority of meals   Starches - Plant based foods should make up a majority of meals - They are an important sources of vitamins, minerals, energy, antioxidants, and fiber - Choose whole grains, foods high in fiber and minimally processed items  - Typical grain sources include wheat, oats, barley, corn, brown rice, bulgar, farro, millet, polenta, couscous  - Various types of beans include chickpeas, lentils, fava beans, black beans, white beans   Fruits  Veggies - Large quantities of antioxidant rich fruits & veggies; 6 or more servings  - Vegetables can be eaten raw or lightly drizzled with oil and cooked  - Vegetables common to the traditional Mediterranean Diet include: artichokes, arugula, beets, broccoli, brussel sprouts, cabbage, carrots, celery, collard greens, cucumbers, eggplant, kale, leeks, lemons, lettuce, mushrooms, okra, onions, peas, peppers, potatoes, pumpkin, radishes, rutabaga, shallots, spinach, sweet potatoes, turnips, zucchini - Fruits common to the Mediterranean Diet include: apples, apricots, avocados, cherries, clementines, dates, figs, grapefruits, grapes, melons, nectarines, oranges, peaches, pears, pomegranates, strawberries, tangerines  Fats - Replace butter and margarine with healthy oils, such as olive oil, canola oil, and tahini  - Limit nuts to no more than a handful a day  - Nuts include walnuts, almonds, pecans, pistachios, pine  nuts  - Limit or avoid candied, honey roasted or heavily salted nuts - Olives are central to the [employer] - can be eaten whole or used in a variety of dishes   Meats Protein - Limiting red meat: no more than a few times a month - When eating red meat: choose lean cuts and keep the portion to the size of deck of cards - Eggs: approx. 0 to 4 times a week  - Fish and lean poultry: at least 2 a week  - Healthy protein sources include, chicken, [place], lean beef, lamb - Increase intake of seafood such as tuna, salmon, trout, mackerel, shrimp, scallops - Avoid or limit high fat processed meats such as sausage and bacon  Dairy - Include moderate amounts of low fat dairy products  - Focus on healthy dairy such as fat free yogurt, skim milk, low or reduced fat cheese - Limit dairy products higher in fat such as whole or 2% milk, cheese, ice cream  Alcohol - Moderate amounts of red wine is ok  - No more than 5 oz daily for women (all ages) and men older than age 34  - No more than 10 oz of wine daily for men younger than 21  Other - Limit sweets and other desserts  - Use herbs and spices  instead of salt to flavor foods  - Herbs and spices common to the traditional Mediterranean Diet include: basil, bay leaves, chives, cloves, cumin, fennel, garlic, lavender, marjoram, mint, oregano, parsley, pepper, rosemary, sage, savory, sumac, tarragon, thyme   It's not just a diet, it's a lifestyle:  . The Mediterranean diet includes lifestyle factors typical of those in the region  . Foods, drinks and meals are best eaten with others and savored . Daily physical activity is important for overall good health . This could be strenuous exercise like running and aerobics . This could also be more leisurely activities such as walking, housework, yard-work, or taking the stairs . Moderation is the key; a balanced and healthy diet accommodates most foods and drinks . Consider portion sizes and frequency of  consumption of certain foods   Meal Ideas & Options:  . Breakfast:  o Whole wheat toast or whole wheat English muffins with peanut butter & hard boiled egg o Steel cut oats topped with apples & cinnamon and skim milk  o Fresh fruit: banana, strawberries, melon, berries, peaches  o Smoothies: strawberries, bananas, greek yogurt, peanut butter o Low fat greek yogurt with blueberries and granola  o Egg white omelet with spinach and mushrooms o Breakfast couscous: whole wheat couscous, apricots, skim milk, cranberries  . Sandwiches:  o Hummus and grilled vegetables (peppers, zucchini, squash) on whole wheat bread   o Grilled chicken on whole wheat pita with lettuce, tomatoes, cucumbers or tzatziki  o Tuna salad on whole wheat bread: tuna salad made with greek yogurt, olives, red peppers, capers, green onions o Garlic rosemary lamb pita: lamb sauted with garlic, rosemary, salt & pepper; add lettuce, cucumber, greek yogurt to pita - flavor with lemon juice and black pepper  . Seafood:  o Mediterranean grilled salmon, seasoned with garlic, basil, parsley, lemon juice and black pepper o Shrimp, lemon, and spinach whole-grain pasta salad made with low fat greek yogurt  o Seared scallops with lemon orzo  o Seared tuna steaks seasoned salt, pepper, coriander topped with tomato mixture of olives, tomatoes, olive oil, minced garlic, parsley, green onions and cappers  . Meats:  o Herbed greek chicken salad with kalamata olives, cucumber, feta  o Red bell peppers stuffed with spinach, bulgur, lean ground beef (or lentils) & topped with feta   o Kebabs: skewers of chicken, tomatoes, onions, zucchini, squash  o [place] burgers: made with red onions, mint, dill, lemon juice, feta cheese topped with roasted red peppers . Vegetarian o Cucumber salad: cucumbers, artichoke hearts, celery, red onion, feta cheese, tossed in olive oil & lemon juice  o Hummus and whole grain pita points with a greek salad  (lettuce, tomato, feta, olives, cucumbers, red onion) o Lentil soup with celery, carrots made with vegetable broth, garlic, salt and pepper  o Tabouli salad: parsley, bulgur, mint, scallions, cucumbers, tomato, radishes, lemon juice, olive oil, salt and pepper.    Exercise to Lose Weight Exercise and a healthy diet may help you lose weight. Your doctor may suggest specific exercises. EXERCISE IDEAS AND TIPS  Choose low-cost things you enjoy doing, such as walking, bicycling, or exercising to workout videos.  Take stairs instead of the elevator.  Walk during your lunch break.  Park your car further away from work or school.  Go to a gym or an exercise class.  Start with 5 to 10 minutes of exercise each day. Build up to 30 minutes of exercise 4 to 6 days a week.  Wear shoes with good support and comfortable clothes.  Stretch before and after working out.  Work out until you breathe harder and your heart beats faster.  Drink extra water when you exercise.  Do not do so much that you hurt yourself, feel dizzy, or get very short of breath. Exercises that burn about 150 calories:  Running 1  miles in 15 minutes.  Playing volleyball for 45 to 60 minutes.  Washing and waxing a car for 45 to 60 minutes.  Playing touch football for 45 minutes.  Walking 1  miles in 35 minutes.  Pushing a stroller 1  miles in 30 minutes.  Playing basketball for 30 minutes.  Raking leaves for 30 minutes.  Bicycling 5 miles in 30 minutes.  Walking 2 miles in 30 minutes.  Dancing for 30 minutes.  Shoveling snow for 15 minutes.  Swimming laps for 20 minutes.  Walking up stairs for 15 minutes.  Bicycling 4 miles in 15 minutes.  Gardening for 30 to 45 minutes.  Jumping rope for 15 minutes.  Washing windows or floors for 45 to 60 minutes. Document Released: 06/06/2010 Document Revised: 07/27/2011 Document Reviewed: 06/06/2010 [facility] Patient Information 2015 [place], [place]. This  information is not intended to replace advice given to you by your health care provider. Make sure you discuss any questions you have with your health care provider.
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
## 2014-07-12 LAB — URINE CULTURE
Colony Count: NO GROWTH
Organism ID, Bacteria: NO GROWTH

## 2014-07-13 LAB — CBC WITH DIFFERENTIAL/PLATELET
BASOS: 1 %
Basophils Absolute: 0 10*3/uL (ref 0.0–0.2)
Eos: 2 %
Eosinophils Absolute: 0.1 10*3/uL (ref 0.0–0.4)
HCT: 43.2 % (ref 34.0–46.6)
Hemoglobin: 14.7 g/dL (ref 11.1–15.9)
IMMATURE GRANULOCYTES: 0 %
Immature Grans (Abs): 0 10*3/uL (ref 0.0–0.1)
Lymphocytes Absolute: 1.7 10*3/uL (ref 0.7–3.1)
Lymphs: 31 %
MCH: 30.4 pg (ref 26.6–33.0)
MCHC: 34 g/dL (ref 31.5–35.7)
MCV: 89 fL (ref 79–97)
Monocytes Absolute: 0.2 10*3/uL (ref 0.1–0.9)
Monocytes: 4 %
NEUTROS PCT: 62 %
Neutrophils Absolute: 3.4 10*3/uL (ref 1.4–7.0)
PLATELETS: 267 10*3/uL (ref 150–379)
RBC: 4.84 x10E6/uL (ref 3.77–5.28)
RDW: 13.9 % (ref 12.3–15.4)
WBC: 5.4 10*3/uL (ref 3.4–10.8)

## 2014-07-13 LAB — BASIC METABOLIC PANEL
BUN/Creatinine Ratio: 13 (ref 11–26)
BUN: 15 mg/dL (ref 8–27)
CO2: 27 mmol/L (ref 18–29)
Calcium: 10 mg/dL (ref 8.7–10.3)
Chloride: 94 mmol/L — ABNORMAL LOW (ref 97–108)
Creatinine, Ser: 1.16 mg/dL — ABNORMAL HIGH (ref 0.57–1.00)
GFR calc Af Amer: 59 mL/min/{1.73_m2} — ABNORMAL LOW (ref >59)
GFR calc non Af Amer: 51 mL/min/{1.73_m2} — ABNORMAL LOW (ref >59)
GLUCOSE: 144 mg/dL — AB (ref 65–99)
Potassium: 3.8 mmol/L (ref 3.5–5.2)
Sodium: 139 mmol/L (ref 134–144)

## 2014-07-13 LAB — LIPID PANEL
CHOL/HDL RATIO: 3.9 ratio (ref 0.0–4.4)
Cholesterol, Total: 233 mg/dL — ABNORMAL HIGH (ref 100–199)
HDL: 59 mg/dL (ref >39)
LDL Calculated: 154 mg/dL — ABNORMAL HIGH (ref 0–99)
Triglycerides: 98 mg/dL (ref 0–149)
VLDL CHOLESTEROL CAL: 20 mg/dL (ref 5–40)

## 2014-07-13 LAB — HEMOGLOBIN A1C
Est. average glucose Bld gHb Est-mCnc: 143 mg/dL
HEMOGLOBIN A1C: 6.6 % — AB (ref 4.8–5.6)

## 2014-07-13 LAB — VITAMIN D 25 HYDROXY (VIT D DEFICIENCY, FRACTURES): VIT D 25 HYDROXY: 36.9 ng/mL (ref 30.0–100.0)

## 2014-07-13 LAB — MAGNESIUM: MAGNESIUM: 1.8 mg/dL (ref 1.6–2.3)

## 2014-07-15 NOTE — Progress Notes (Signed)
Quick Note:  Please advise pt regarding following labs... Urine culture shows no growth. You do not have a bladder infection.  The A1c test (Diabetes test to monitor control) has increased up to 6.6%. This is Diabetes range and may need to be treated with medication. Previous value was 5.8% . Be mindful about processed foods and starch/sugar intake, portion sizes and try to increase physical activity.   Magnesium and Vitamin D levels are just barely normal. You need a good multivitamin that contains important minerals for healthy cardiovascular health.   Cholesterol is above normal; it needs to be below 200. LDL ("bad") cholesterol is too high; it needs to be below 100. Fortunately, HDL ("good") cholesterol is high = 59.  Please schedule a follow-up appointment to discuss Diabetes and treatment of elevated cholesterol. This can be diet- controlled; if you agree, I can refer you to a nutritionist for guidance.   Copy to pt. ______

## 2014-07-16 ENCOUNTER — Encounter: Payer: Self-pay | Admitting: Family Medicine

## 2014-08-06 ENCOUNTER — Other Ambulatory Visit: Payer: Self-pay | Admitting: Family Medicine

## 2014-08-08 NOTE — Telephone Encounter (Signed)
Trazodone refilled x 6 months.

## 2014-08-08 NOTE — Telephone Encounter (Signed)
Dr Leward Quan, you saw pt last mos for check up, but don't see this addressed. Can we RF?

## 2014-10-16 ENCOUNTER — Other Ambulatory Visit: Payer: Self-pay | Admitting: Obstetrics and Gynecology

## 2014-10-17 LAB — CYTOLOGY - PAP

## 2015-01-29 ENCOUNTER — Encounter: Payer: Self-pay | Admitting: Physician Assistant

## 2015-01-29 ENCOUNTER — Ambulatory Visit (INDEPENDENT_AMBULATORY_CARE_PROVIDER_SITE_OTHER): Payer: 59 | Admitting: Physician Assistant

## 2015-01-29 VITALS — BP 130/80 | HR 73 | Temp 98.4°F | Resp 16 | Ht 63.25 in | Wt 187.2 lb

## 2015-01-29 DIAGNOSIS — E7800 Pure hypercholesterolemia, unspecified: Secondary | ICD-10-CM

## 2015-01-29 DIAGNOSIS — A5901 Trichomonal vulvovaginitis: Secondary | ICD-10-CM | POA: Diagnosis not present

## 2015-01-29 DIAGNOSIS — N898 Other specified noninflammatory disorders of vagina: Secondary | ICD-10-CM

## 2015-01-29 DIAGNOSIS — R7302 Impaired glucose tolerance (oral): Secondary | ICD-10-CM | POA: Diagnosis not present

## 2015-01-29 DIAGNOSIS — G4700 Insomnia, unspecified: Secondary | ICD-10-CM | POA: Diagnosis not present

## 2015-01-29 DIAGNOSIS — I10 Essential (primary) hypertension: Secondary | ICD-10-CM

## 2015-01-29 DIAGNOSIS — N939 Abnormal uterine and vaginal bleeding, unspecified: Secondary | ICD-10-CM

## 2015-01-29 DIAGNOSIS — R7309 Other abnormal glucose: Secondary | ICD-10-CM

## 2015-01-29 LAB — POCT WET PREP WITH KOH
KOH Prep POC: POSITIVE
Trichomonas, UA: POSITIVE

## 2015-01-29 MED ORDER — METRONIDAZOLE 500 MG PO TABS: ORAL_TABLET | ORAL | Status: DC
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
## 2015-01-29 NOTE — Patient Instructions (Addendum)
Take 4 tabs flagyl once. Take with food. No sex for 1 week. Condoms for every sexual encounter. Have your partner get treated. Start over the counter fish oil supplement daily - nature's made is a common one. Start over the counter aspirin 81 mg. Work on diet and weight loss. Return in 6 months for follow up and blood work.     Why follow it? Research shows. . Those who follow the Mediterranean diet have a reduced risk of heart disease  . The diet is associated with a reduced incidence of Parkinson's and Alzheimer's diseases . People following the diet may have longer life expectancies and lower rates of chronic diseases  . The Dietary Guidelines for Americans recommends the Mediterranean diet as an eating plan to promote health and prevent disease  What Is the Mediterranean Diet?  . Healthy eating plan based on typical foods and recipes of Mediterranean-style cooking . The diet is primarily a plant based diet; these foods should make up a majority of meals   Starches - Plant based foods should make up a majority of meals - They are an important sources of vitamins, minerals, energy, antioxidants, and fiber - Choose whole grains, foods high in fiber and minimally processed items  - Typical grain sources include wheat, oats, barley, corn, brown rice, bulgar, farro, millet, polenta, couscous  - Various types of beans include chickpeas, lentils, fava beans, black beans, white beans   Fruits  Veggies - Large quantities of antioxidant rich fruits & veggies; 6 or more servings  - Vegetables can be eaten raw or lightly drizzled with oil and cooked  - Vegetables common to the traditional Mediterranean Diet include: artichokes, arugula, beets, broccoli, brussel sprouts, cabbage, carrots, celery, collard greens, cucumbers, eggplant, kale, leeks, lemons, lettuce, mushrooms, okra, onions, peas, peppers, potatoes, pumpkin, radishes, rutabaga, shallots, spinach, sweet potatoes, turnips, zucchini - Fruits  common to the Mediterranean Diet include: apples, apricots, avocados, cherries, clementines, dates, figs, grapefruits, grapes, melons, nectarines, oranges, peaches, pears, pomegranates, strawberries, tangerines  Fats - Replace butter and margarine with healthy oils, such as olive oil, canola oil, and tahini  - Limit nuts to no more than a handful a day  - Nuts include walnuts, almonds, pecans, pistachios, pine nuts  - Limit or avoid candied, honey roasted or heavily salted nuts - Olives are central to the [employer] - can be eaten whole or used in a variety of dishes   Meats Protein - Limiting red meat: no more than a few times a month - When eating red meat: choose lean cuts and keep the portion to the size of deck of cards - Eggs: approx. 0 to 4 times a week  - Fish and lean poultry: at least 2 a week  - Healthy protein sources include, chicken, [place], lean beef, lamb - Increase intake of seafood such as tuna, salmon, trout, mackerel, shrimp, scallops - Avoid or limit high fat processed meats such as sausage and bacon  Dairy - Include moderate amounts of low fat dairy products  - Focus on healthy dairy such as fat free yogurt, skim milk, low or reduced fat cheese - Limit dairy products higher in fat such as whole or 2% milk, cheese, ice cream  Alcohol - Moderate amounts of red wine is ok  - No more than 5 oz daily for women (all ages) and men older than age 65  - No more than 10 oz of wine daily for men younger than 30  Other -  Limit sweets and other desserts  - Use herbs and spices instead of salt to flavor foods  - Herbs and spices common to the traditional Mediterranean Diet include: basil, bay leaves, chives, cloves, cumin, fennel, garlic, lavender, marjoram, mint, oregano, parsley, pepper, rosemary, sage, savory, sumac, tarragon, thyme   It's not just a diet, it's a lifestyle:  . The Mediterranean diet includes lifestyle factors typical of those in the region  . Foods, drinks  and meals are best eaten with others and savored . Daily physical activity is important for overall good health . This could be strenuous exercise like running and aerobics . This could also be more leisurely activities such as walking, housework, yard-work, or taking the stairs . Moderation is the key; a balanced and healthy diet accommodates most foods and drinks . Consider portion sizes and frequency of consumption of certain foods   Meal Ideas & Options:  . Breakfast:  o Whole wheat toast or whole wheat English muffins with peanut butter & hard boiled egg o Steel cut oats topped with apples & cinnamon and skim milk  o Fresh fruit: banana, strawberries, melon, berries, peaches  o Smoothies: strawberries, bananas, greek yogurt, peanut butter o Low fat greek yogurt with blueberries and granola  o Egg white omelet with spinach and mushrooms o Breakfast couscous: whole wheat couscous, apricots, skim milk, cranberries  . Sandwiches:  o Hummus and grilled vegetables (peppers, zucchini, squash) on whole wheat bread   o Grilled chicken on whole wheat pita with lettuce, tomatoes, cucumbers or tzatziki  o Tuna salad on whole wheat bread: tuna salad made with greek yogurt, olives, red peppers, capers, green onions o Garlic rosemary lamb pita: lamb sauted with garlic, rosemary, salt & pepper; add lettuce, cucumber, greek yogurt to pita - flavor with lemon juice and black pepper  . Seafood:  o Mediterranean grilled salmon, seasoned with garlic, basil, parsley, lemon juice and black pepper o Shrimp, lemon, and spinach whole-grain pasta salad made with low fat greek yogurt  o Seared scallops with lemon orzo  o Seared tuna steaks seasoned salt, pepper, coriander topped with tomato mixture of olives, tomatoes, olive oil, minced garlic, parsley, green onions and cappers  . Meats:  o Herbed greek chicken salad with kalamata olives, cucumber, feta  o Red bell peppers stuffed with spinach, bulgur, lean  ground beef (or lentils) & topped with feta   o Kebabs: skewers of chicken, tomatoes, onions, zucchini, squash  o [place] burgers: made with red onions, mint, dill, lemon juice, feta cheese topped with roasted red peppers . Vegetarian o Cucumber salad: cucumbers, artichoke hearts, celery, red onion, feta cheese, tossed in olive oil & lemon juice  o Hummus and whole grain pita points with a greek salad (lettuce, tomato, feta, olives, cucumbers, red onion) o Lentil soup with celery, carrots made with vegetable broth, garlic, salt and pepper  o Tabouli salad: parsley, bulgur, mint, scallions, cucumbers, tomato, radishes, lemon juice, olive oil, salt and pepper.

## 2015-01-31 LAB — GC/CHLAMYDIA PROBE AMP
CHLAMYDIA, DNA PROBE: NEGATIVE
NEISSERIA GONORRHOEAE BY PCR: NEGATIVE

## 2015-02-01 ENCOUNTER — Other Ambulatory Visit: Payer: Self-pay

## 2015-02-01 DIAGNOSIS — A5901 Trichomonal vulvovaginitis: Secondary | ICD-10-CM

## 2015-02-01 MED ORDER — METRONIDAZOLE 500 MG PO TABS: ORAL_TABLET | ORAL | Status: DC

## 2015-02-27 ENCOUNTER — Other Ambulatory Visit: Payer: Self-pay

## 2015-02-27 MED ORDER — TRAZODONE HCL 50 MG PO TABS: 75.0000 mg | ORAL_TABLET | Freq: Every evening | ORAL | Status: DC | PRN

## 2015-02-27 NOTE — Telephone Encounter (Signed)
Jane Pena increased pt's dosage at last OV 9/13.

## 2015-02-28 ENCOUNTER — Encounter: Payer: Self-pay | Admitting: Gastroenterology

## 2015-05-30 ENCOUNTER — Other Ambulatory Visit: Payer: Self-pay

## 2015-05-30 MED ORDER — TRAZODONE HCL 50 MG PO TABS: 75.0000 mg | ORAL_TABLET | Freq: Every evening | ORAL | Status: DC | PRN

## 2015-11-17 ENCOUNTER — Other Ambulatory Visit: Payer: Self-pay | Admitting: Family Medicine

## 2015-11-20 NOTE — Telephone Encounter (Signed)
Patient is due for a follow up for prescription refill.  What is plan?

## 2015-12-13 ENCOUNTER — Other Ambulatory Visit: Payer: Self-pay | Admitting: Family Medicine

## 2015-12-23 ENCOUNTER — Other Ambulatory Visit: Payer: Self-pay | Admitting: Family Medicine

## 2016-01-30 ENCOUNTER — Ambulatory Visit (INDEPENDENT_AMBULATORY_CARE_PROVIDER_SITE_OTHER): Payer: 59 | Admitting: Family Medicine

## 2016-01-30 ENCOUNTER — Encounter: Payer: Self-pay | Admitting: Family Medicine

## 2016-01-30 VITALS — BP 132/80 | HR 72 | Temp 98.0°F | Resp 16 | Ht 63.0 in | Wt 183.6 lb

## 2016-01-30 DIAGNOSIS — Z1329 Encounter for screening for other suspected endocrine disorder: Secondary | ICD-10-CM

## 2016-01-30 DIAGNOSIS — E119 Type 2 diabetes mellitus without complications: Secondary | ICD-10-CM

## 2016-01-30 DIAGNOSIS — I10 Essential (primary) hypertension: Secondary | ICD-10-CM | POA: Diagnosis not present

## 2016-01-30 DIAGNOSIS — Z1383 Encounter for screening for respiratory disorder NEC: Secondary | ICD-10-CM | POA: Diagnosis not present

## 2016-01-30 DIAGNOSIS — Z136 Encounter for screening for cardiovascular disorders: Secondary | ICD-10-CM | POA: Diagnosis not present

## 2016-01-30 DIAGNOSIS — N189 Chronic kidney disease, unspecified: Secondary | ICD-10-CM | POA: Diagnosis not present

## 2016-01-30 DIAGNOSIS — Z1231 Encounter for screening mammogram for malignant neoplasm of breast: Secondary | ICD-10-CM | POA: Diagnosis not present

## 2016-01-30 DIAGNOSIS — Z0000 Encounter for general adult medical examination without abnormal findings: Secondary | ICD-10-CM

## 2016-01-30 DIAGNOSIS — R7302 Impaired glucose tolerance (oral): Secondary | ICD-10-CM | POA: Diagnosis not present

## 2016-01-30 DIAGNOSIS — D509 Iron deficiency anemia, unspecified: Secondary | ICD-10-CM | POA: Diagnosis not present

## 2016-01-30 DIAGNOSIS — E7800 Pure hypercholesterolemia, unspecified: Secondary | ICD-10-CM

## 2016-01-30 DIAGNOSIS — Z5181 Encounter for therapeutic drug level monitoring: Secondary | ICD-10-CM

## 2016-01-30 DIAGNOSIS — Z1211 Encounter for screening for malignant neoplasm of colon: Secondary | ICD-10-CM | POA: Diagnosis not present

## 2016-01-30 DIAGNOSIS — R7309 Other abnormal glucose: Secondary | ICD-10-CM

## 2016-01-30 DIAGNOSIS — Z113 Encounter for screening for infections with a predominantly sexual mode of transmission: Secondary | ICD-10-CM | POA: Diagnosis not present

## 2016-01-30 DIAGNOSIS — Z130 Encounter for screening for diseases of the blood and blood-forming organs and certain disorders involving the immune mechanism: Secondary | ICD-10-CM | POA: Diagnosis not present

## 2016-01-30 DIAGNOSIS — E559 Vitamin D deficiency, unspecified: Secondary | ICD-10-CM | POA: Diagnosis not present

## 2016-01-30 DIAGNOSIS — Z1212 Encounter for screening for malignant neoplasm of rectum: Secondary | ICD-10-CM

## 2016-01-30 DIAGNOSIS — Z1389 Encounter for screening for other disorder: Secondary | ICD-10-CM

## 2016-01-30 DIAGNOSIS — G4700 Insomnia, unspecified: Secondary | ICD-10-CM

## 2016-01-30 LAB — POCT URINALYSIS DIP (MANUAL ENTRY)
BILIRUBIN UA: NEGATIVE
Bilirubin, UA: NEGATIVE
Blood, UA: NEGATIVE
Glucose, UA: NEGATIVE
Leukocytes, UA: NEGATIVE
Nitrite, UA: NEGATIVE
PH UA: 5
PROTEIN UA: NEGATIVE
SPEC GRAV UA: 1.01
Urobilinogen, UA: 0.2

## 2016-01-30 LAB — POC MICROSCOPIC URINALYSIS (UMFC): MUCUS RE: ABSENT

## 2016-01-30 MED ORDER — AMLODIPINE BESYLATE-VALSARTAN 10-320 MG PO TABS: 1.0000 | ORAL_TABLET | Freq: Every day | ORAL | 3 refills | Status: DC

## 2016-01-30 MED ORDER — SUVOREXANT 10 MG PO TABS: 1.0000 | ORAL_TABLET | Freq: Every evening | ORAL | 1 refills | Status: DC | PRN

## 2016-01-30 NOTE — Patient Instructions (Addendum)
   IF you received an x-ray today, you will receive an invoice from [facility]. Please contact [facility] at [phone] with questions or concerns regarding your invoice.   IF you received labwork today, you will receive an invoice from [facility]. Please contact [facility] at [phone] with questions or concerns regarding your invoice.   Our billing staff will not be able to assist you with questions regarding bills from these companies.  You will be contacted with the lab results as soon as they are available. The fastest way to get your results is to activate your My Chart account. Instructions are located on the last page of this paperwork. If you have not heard from us regarding the results in 2 weeks, please contact this office.     Health Maintenance, Female Adopting a healthy lifestyle and getting preventive care can go a long way to promote health and wellness. Talk with your health care provider about what schedule of regular examinations is right for you. This is a good chance for you to check in with your provider about disease prevention and staying healthy. In between checkups, there are plenty of things you can do on your own. Experts have done a lot of research about which lifestyle changes and preventive measures are most likely to keep you healthy. Ask your health care provider for more information. WEIGHT AND DIET  Eat a healthy diet  Be sure to include plenty of vegetables, fruits, low-fat dairy products, and lean protein.  Do not eat a lot of foods high in solid fats, added sugars, or salt.  Get regular exercise. This is one of the most important things you can do for your health.  Most adults should exercise for at least 150 minutes each week. The exercise should increase your heart rate and make you sweat (moderate-intensity exercise).  Most adults should also do strengthening exercises at least twice a week. This  is in addition to the moderate-intensity exercise.  Maintain a healthy weight  Body mass index (BMI) is a measurement that can be used to identify possible weight problems. It estimates body fat based on height and weight. Your health care provider can help determine your BMI and help you achieve or maintain a healthy weight.  For females 20 years of age and older:   A BMI below 18.5 is considered underweight.  A BMI of 18.5 to 24.9 is normal.  A BMI of 25 to 29.9 is considered overweight.  A BMI of 30 and above is considered obese.  Watch levels of cholesterol and blood lipids  You should start having your blood tested for lipids and cholesterol at 62 years of age, then have this test every 5 years.  You may need to have your cholesterol levels checked more often if:  Your lipid or cholesterol levels are high.  You are older than 62 years of age.  You are at high risk for heart disease.  CANCER SCREENING   Lung Cancer  Lung cancer screening is recommended for adults 55-80 years old who are at high risk for lung cancer because of a history of smoking.  A yearly low-dose CT scan of the lungs is recommended for people who:  Currently smoke.  Have quit within the past 15 years.  Have at least a 30-pack-year history of smoking. A pack year is smoking an average of one pack of cigarettes a day for 1 year.  Yearly screening should continue until it has been   15 years since you quit.  Yearly screening should stop if you develop a health problem that would prevent you from having lung cancer treatment.  Breast Cancer  Practice breast self-awareness. This means understanding how your breasts normally appear and feel.  It also means doing regular breast self-exams. Let your health care provider know about any changes, no matter how small.  If you are in your 20s or 30s, you should have a clinical breast exam (CBE) by a health care provider every 1-3 years as part of a  regular health exam.  If you are 40 or older, have a CBE every year. Also consider having a breast X-ray (mammogram) every year.  If you have a family history of breast cancer, talk to your health care provider about genetic screening.  If you are at high risk for breast cancer, talk to your health care provider about having an MRI and a mammogram every year.  Breast cancer gene (BRCA) assessment is recommended for women who have family members with BRCA-related cancers. BRCA-related cancers include:  Breast.  Ovarian.  Tubal.  Peritoneal cancers.  Results of the assessment will determine the need for genetic counseling and BRCA1 and BRCA2 testing. Cervical Cancer Your health care provider may recommend that you be screened regularly for cancer of the pelvic organs (ovaries, uterus, and vagina). This screening involves a pelvic examination, including checking for microscopic changes to the surface of your cervix (Pap test). You may be encouraged to have this screening done every 3 years, beginning at age 21.  For women ages 30-65, health care providers may recommend pelvic exams and Pap testing every 3 years, or they may recommend the Pap and pelvic exam, combined with testing for human papilloma virus (HPV), every 5 years. Some types of HPV increase your risk of cervical cancer. Testing for HPV may also be done on women of any age with unclear Pap test results.  Other health care providers may not recommend any screening for nonpregnant women who are considered low risk for pelvic cancer and who do not have symptoms. Ask your health care provider if a screening pelvic exam is right for you.  If you have had past treatment for cervical cancer or a condition that could lead to cancer, you need Pap tests and screening for cancer for at least 20 years after your treatment. If Pap tests have been discontinued, your risk factors (such as having a new sexual partner) need to be reassessed to  determine if screening should resume. Some women have medical problems that increase the chance of getting cervical cancer. In these cases, your health care provider may recommend more frequent screening and Pap tests. Colorectal Cancer  This type of cancer can be detected and often prevented.  Routine colorectal cancer screening usually begins at 62 years of age and continues through 62 years of age.  Your health care provider may recommend screening at an earlier age if you have risk factors for colon cancer.  Your health care provider may also recommend using home test kits to check for hidden blood in the stool.  A small camera at the end of a tube can be used to examine your colon directly (sigmoidoscopy or colonoscopy). This is done to check for the earliest forms of colorectal cancer.  Routine screening usually begins at age 50.  Direct examination of the colon should be repeated every 5-10 years through 62 years of age. However, you may need to be screened more often if   early forms of precancerous polyps or small growths are found. Skin Cancer  Check your skin from head to toe regularly.  Tell your health care provider about any new moles or changes in moles, especially if there is a change in a mole's shape or color.  Also tell your health care provider if you have a mole that is larger than the size of a pencil eraser.  Always use sunscreen. Apply sunscreen liberally and repeatedly throughout the day.  Protect yourself by wearing long sleeves, pants, a wide-brimmed hat, and sunglasses whenever you are outside. HEART DISEASE, DIABETES, AND HIGH BLOOD PRESSURE   High blood pressure causes heart disease and increases the risk of stroke. High blood pressure is more likely to develop in:  People who have blood pressure in the high end of the normal range (130-139/85-89 mm Hg).  People who are overweight or obese.  People who are African American.  If you are 18-39 years of  age, have your blood pressure checked every 3-5 years. If you are 40 years of age or older, have your blood pressure checked every year. You should have your blood pressure measured twice--once when you are at a hospital or clinic, and once when you are not at a hospital or clinic. Record the average of the two measurements. To check your blood pressure when you are not at a hospital or clinic, you can use:  An automated blood pressure machine at a pharmacy.  A home blood pressure monitor.  If you are between 55 years and 79 years old, ask your health care provider if you should take aspirin to prevent strokes.  Have regular diabetes screenings. This involves taking a blood sample to check your fasting blood sugar level.  If you are at a normal weight and have a low risk for diabetes, have this test once every three years after 62 years of age.  If you are overweight and have a high risk for diabetes, consider being tested at a younger age or more often. PREVENTING INFECTION  Hepatitis B  If you have a higher risk for hepatitis B, you should be screened for this virus. You are considered at high risk for hepatitis B if:  You were born in a country where hepatitis B is common. Ask your health care provider which countries are considered high risk.  Your parents were born in a high-risk country, and you have not been immunized against hepatitis B (hepatitis B vaccine).  You have HIV or AIDS.  You use needles to inject street drugs.  You live with someone who has hepatitis B.  You have had sex with someone who has hepatitis B.  You get hemodialysis treatment.  You take certain medicines for conditions, including cancer, organ transplantation, and autoimmune conditions. Hepatitis C  Blood testing is recommended for:  Everyone born from 1945 through 1965.  Anyone with known risk factors for hepatitis C. Sexually transmitted infections (STIs)  You should be screened for sexually  transmitted infections (STIs) including gonorrhea and chlamydia if:  You are sexually active and are younger than 62 years of age.  You are older than 62 years of age and your health care provider tells you that you are at risk for this type of infection.  Your sexual activity has changed since you were last screened and you are at an increased risk for chlamydia or gonorrhea. Ask your health care provider if you are at risk.  If you do not have HIV, but   are at risk, it may be recommended that you take a prescription medicine daily to prevent HIV infection. This is called pre-exposure prophylaxis (PrEP). You are considered at risk if:  You are sexually active and do not regularly use condoms or know the HIV status of your partner(s).  You take drugs by injection.  You are sexually active with a partner who has HIV. Talk with your health care provider about whether you are at high risk of being infected with HIV. If you choose to begin PrEP, you should first be tested for HIV. You should then be tested every 3 months for as long as you are taking PrEP.  PREGNANCY   If you are premenopausal and you may become pregnant, ask your health care provider about preconception counseling.  If you may become pregnant, take 400 to 800 micrograms (mcg) of folic acid every day.  If you want to prevent pregnancy, talk to your health care provider about birth control (contraception). OSTEOPOROSIS AND MENOPAUSE   Osteoporosis is a disease in which the bones lose minerals and strength with aging. This can result in serious bone fractures. Your risk for osteoporosis can be identified using a bone density scan.  If you are 65 years of age or older, or if you are at risk for osteoporosis and fractures, ask your health care provider if you should be screened.  Ask your health care provider whether you should take a calcium or vitamin D supplement to lower your risk for osteoporosis.  Menopause may have  certain physical symptoms and risks.  Hormone replacement therapy may reduce some of these symptoms and risks. Talk to your health care provider about whether hormone replacement therapy is right for you.  HOME CARE INSTRUCTIONS   Schedule regular health, dental, and eye exams.  Stay current with your immunizations.   Do not use any tobacco products including cigarettes, chewing tobacco, or electronic cigarettes.  If you are pregnant, do not drink alcohol.  If you are breastfeeding, limit how much and how often you drink alcohol.  Limit alcohol intake to no more than 1 drink per day for nonpregnant women. One drink equals 12 ounces of beer, 5 ounces of wine, or 1 ounces of hard liquor.  Do not use street drugs.  Do not share needles.  Ask your health care provider for help if you need support or information about quitting drugs.  Tell your health care provider if you often feel depressed.  Tell your health care provider if you have ever been abused or do not feel safe at home.   This information is not intended to replace advice given to you by your health care provider. Make sure you discuss any questions you have with your health care provider.   Document Released: 11/17/2010 Document Revised: 05/25/2014 Document Reviewed: 04/05/2013 Elsevier Interactive Patient Education 2016 Elsevier Inc.  
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
## 2016-01-31 LAB — COMPREHENSIVE METABOLIC PANEL
ALBUMIN: 4.7 g/dL (ref 3.6–4.8)
ALK PHOS: 80 IU/L (ref 39–117)
ALT: 27 IU/L (ref 0–32)
AST: 26 IU/L (ref 0–40)
Albumin/Globulin Ratio: 1.6 (ref 1.2–2.2)
BILIRUBIN TOTAL: 0.5 mg/dL (ref 0.0–1.2)
BUN / CREAT RATIO: 13 (ref 12–28)
BUN: 14 mg/dL (ref 8–27)
CHLORIDE: 98 mmol/L (ref 96–106)
CO2: 29 mmol/L (ref 18–29)
CREATININE: 1.05 mg/dL — AB (ref 0.57–1.00)
Calcium: 10.4 mg/dL — ABNORMAL HIGH (ref 8.7–10.3)
GFR calc Af Amer: 66 mL/min/{1.73_m2} (ref >59)
GFR calc non Af Amer: 57 mL/min/{1.73_m2} — ABNORMAL LOW (ref >59)
GLOBULIN, TOTAL: 3 g/dL (ref 1.5–4.5)
Glucose: 113 mg/dL — ABNORMAL HIGH (ref 65–99)
Potassium: 4.1 mmol/L (ref 3.5–5.2)
SODIUM: 141 mmol/L (ref 134–144)
Total Protein: 7.7 g/dL (ref 6.0–8.5)

## 2016-01-31 LAB — MICROALBUMIN / CREATININE URINE RATIO
CREATININE, UR: 59.8 mg/dL
MICROALB/CREAT RATIO: <5 mg/g creat (ref 0.0–30.0)
Microalbumin, Urine: <3 ug/mL

## 2016-01-31 LAB — CBC WITH DIFFERENTIAL/PLATELET
BASOS: 1 %
Basophils Absolute: 0 10*3/uL (ref 0.0–0.2)
EOS (ABSOLUTE): 0.1 10*3/uL (ref 0.0–0.4)
EOS: 2 %
HEMATOCRIT: 42.6 % (ref 34.0–46.6)
HEMOGLOBIN: 15 g/dL (ref 11.1–15.9)
IMMATURE GRANS (ABS): 0 10*3/uL (ref 0.0–0.1)
IMMATURE GRANULOCYTES: 0 %
LYMPHS: 28 %
Lymphocytes Absolute: 1.8 10*3/uL (ref 0.7–3.1)
MCH: 31.6 pg (ref 26.6–33.0)
MCHC: 35.2 g/dL (ref 31.5–35.7)
MCV: 90 fL (ref 79–97)
Monocytes Absolute: 0.3 10*3/uL (ref 0.1–0.9)
Monocytes: 4 %
NEUTROS ABS: 4.3 10*3/uL (ref 1.4–7.0)
NEUTROS PCT: 65 %
Platelets: 240 10*3/uL (ref 150–379)
RBC: 4.75 x10E6/uL (ref 3.77–5.28)
RDW: 13.3 % (ref 12.3–15.4)
WBC: 6.5 10*3/uL (ref 3.4–10.8)

## 2016-01-31 LAB — LIPID PANEL
CHOLESTEROL TOTAL: 236 mg/dL — AB (ref 100–199)
Chol/HDL Ratio: 4.2 ratio units (ref 0.0–4.4)
HDL: 56 mg/dL (ref >39)
LDL CALC: 163 mg/dL — AB (ref 0–99)
TRIGLYCERIDES: 84 mg/dL (ref 0–149)
VLDL Cholesterol Cal: 17 mg/dL (ref 5–40)

## 2016-01-31 LAB — IRON AND TIBC
IRON SATURATION: 19 % (ref 15–55)
IRON: 66 ug/dL (ref 27–139)
Total Iron Binding Capacity: 356 ug/dL (ref 250–450)
UIBC: 290 ug/dL (ref 118–369)

## 2016-01-31 LAB — HEMOGLOBIN A1C
ESTIMATED AVERAGE GLUCOSE: 160 mg/dL
Hgb A1c MFr Bld: 7.2 % — ABNORMAL HIGH (ref 4.8–5.6)

## 2016-01-31 LAB — HEPATITIS C ANTIBODY

## 2016-01-31 LAB — PHOSPHORUS: Phosphorus: 3 mg/dL (ref 2.5–4.5)

## 2016-01-31 LAB — TSH: TSH: 1.78 u[IU]/mL (ref 0.450–4.500)

## 2016-01-31 LAB — VITAMIN D 25 HYDROXY (VIT D DEFICIENCY, FRACTURES): VIT D 25 HYDROXY: 23 ng/mL — AB (ref 30.0–100.0)

## 2016-01-31 LAB — FERRITIN: FERRITIN: 283 ng/mL — AB (ref 15–150)

## 2016-02-12 MED ORDER — PRAVASTATIN SODIUM 40 MG PO TABS: 40.0000 mg | ORAL_TABLET | Freq: Every day | ORAL | 0 refills | Status: DC

## 2016-02-25 ENCOUNTER — Other Ambulatory Visit: Payer: Self-pay | Admitting: *Deleted

## 2016-02-25 DIAGNOSIS — Z5181 Encounter for therapeutic drug level monitoring: Secondary | ICD-10-CM

## 2016-02-25 NOTE — Addendum Note (Signed)
Addended by: Kem Boroughs D on: 02/25/2016 06:54 PM   Modules accepted: Orders

## 2016-02-26 ENCOUNTER — Encounter: Payer: Self-pay | Admitting: *Deleted

## 2016-03-09 ENCOUNTER — Ambulatory Visit: Payer: 59

## 2016-03-14 ENCOUNTER — Ambulatory Visit (INDEPENDENT_AMBULATORY_CARE_PROVIDER_SITE_OTHER): Payer: 59 | Admitting: Urgent Care

## 2016-03-14 VITALS — BP 124/76 | HR 97 | Temp 98.4°F | Resp 16 | Ht 63.0 in | Wt 181.8 lb

## 2016-03-14 DIAGNOSIS — N898 Other specified noninflammatory disorders of vagina: Secondary | ICD-10-CM | POA: Diagnosis not present

## 2016-03-14 DIAGNOSIS — R630 Anorexia: Secondary | ICD-10-CM | POA: Diagnosis not present

## 2016-03-14 DIAGNOSIS — E785 Hyperlipidemia, unspecified: Secondary | ICD-10-CM | POA: Diagnosis not present

## 2016-03-14 DIAGNOSIS — R8290 Unspecified abnormal findings in urine: Secondary | ICD-10-CM | POA: Diagnosis not present

## 2016-03-14 DIAGNOSIS — R110 Nausea: Secondary | ICD-10-CM | POA: Diagnosis not present

## 2016-03-14 LAB — POC MICROSCOPIC URINALYSIS (UMFC): Mucus: ABSENT

## 2016-03-14 LAB — POCT WET + KOH PREP
TRICH BY WET PREP: ABSENT
YEAST BY KOH: ABSENT
Yeast by wet prep: ABSENT

## 2016-03-14 LAB — POCT URINALYSIS DIP (MANUAL ENTRY)
BILIRUBIN UA: NEGATIVE
Bilirubin, UA: NEGATIVE
Glucose, UA: NEGATIVE
Nitrite, UA: NEGATIVE
PH UA: 5.5
Spec Grav, UA: 1.015
Urobilinogen, UA: 0.2

## 2016-03-14 MED ORDER — METRONIDAZOLE 0.75 % EX GEL: 1.0000 "application " | Freq: Two times a day (BID) | CUTANEOUS | 0 refills | Status: DC

## 2016-03-14 NOTE — Progress Notes (Addendum)
Patient ID: Jane Pena, female   DOB: 1955/08/09, 60 y.o.   MRN: [id number]  By signing my name below I, Jane Pena, attest that this documentation has been prepared under the direction and in the presence of Jane Pena, [place]. Electonically Signed. Jane Pena, Scribe 03/14/2016 at 9:57 AM   MRN: [id number] DOB: [birth year]  Subjective:   Jane Pena is a 60 y.o. female presenting for chief complaint of OTHER (pt states she was put on pravastatin and its making her feel nausea and loss of app) and Vaginal Discharge (x 2 wks)  HL- Managed with pravastatin. Started on 40mg  pravastatin 3 weeks ago and reports side effects of nausea and reduced appetite. Denies body aches or muscle cramping. Walks 3 miles a week for exercise.   Vaginal discharge- Reports 2 week history of brownish vaginal discharge. Has similar symptoms once a year. Symptoms similar to prior episodes of trichomonas. Has started using body wash for gentle skin. She is sexually active, uses condoms. Denies recent antibiotic use, fever, vomiting, pelvic pain, dysuria, hematuria, or genital rash. She is agreeable to STI testing today.  Jane Pena  Current Outpatient Prescriptions:    amLODipine-valsartan (EXFORGE) 10-320 MG tablet, Take 1 tablet by mouth daily., Disp: 90 tablet, Rfl: 3   Biotin 5 MG CAPS, Take 5 mg by mouth daily. , Disp: , Rfl:    pravastatin (PRAVACHOL) 40 MG tablet, Take 1 tablet (40 mg total) by mouth daily., Disp: 90 tablet, Rfl: 0   traZODone (DESYREL) 50 MG tablet, Take 1 and 1/2 tablets by  mouth at bedtime as needed  for sleep ., Disp: 45 tablet, Rfl: 1   Suvorexant (BELSOMRA) 10 MG TABS, Take 1 tablet by mouth at bedtime as needed. For sleep. Take 8 hours prior to desired awake time (Patient not taking: Reported on 03/14/2016), Disp: 30 tablet, Rfl: 1  Also has No Known Allergies.  Jane Pena  has a past medical history of Hypertension. Also  has a past surgical history that includes Tubal ligation and  Skin grafts (Bilateral).  Objective:   Vitals: BP 124/76    Pulse 97    Temp 98.4 F (36.9 C) (Oral)    Resp 16    Ht 5\' 3"  (1.6 m)    Wt 181 lb 12.8 oz (82.5 kg)    SpO2 99%    BMI 32.20 kg/m   Physical Exam  Constitutional: She is oriented to person, place, and time. She appears well-developed and well-nourished.  Cardiovascular: Normal rate, regular rhythm and intact distal pulses.  Exam reveals no gallop and no friction rub.   No murmur heard. Pulmonary/Chest: No respiratory distress. She has no wheezes. She has no rales.  Abdominal: Soft. Bowel sounds are normal. She exhibits no distension and no mass. There is no tenderness. There is no guarding.  Musculoskeletal: She exhibits no edema.  Neurological: She is alert and oriented to person, place, and time.  Skin: Skin is warm and dry. No rash noted. No erythema. No pallor.   Lipid Panel     Component Value Date/Time   CHOL 236 (H) 01/30/2016 1421   TRIG 84 01/30/2016 1421   HDL 56 01/30/2016 1421   CHOLHDL 4.2 01/30/2016 1421   CHOLHDL 4.3 CALC 07/23/2006 1039   VLDL 22 07/23/2006 1039   LDLCALC 163 (H) 01/30/2016 1421   Results for orders placed or performed in visit on 03/14/16 (from the past 24 hour(s))  POCT Wet + KOH Prep     Status:  Abnormal   Collection Time: 03/14/16  9:57 AM  Result Value Ref Range   Yeast by KOH Absent Present, Absent   Yeast by wet prep Absent Present, Absent   WBC by wet prep Moderate (A) None, Few, Too numerous to count   Clue Cells Wet Prep HPF POC None None, Too numerous to count   Trich by wet prep Absent Present, Absent   Bacteria Wet Prep HPF POC Few None, Few, Too numerous to count   Epithelial Cells By [employer] ([facility]) Few None, Few, Too numerous to count   RBC,UR,HPF,POC None None RBC/hpf  POCT urinalysis dipstick     Status: Abnormal   Collection Time: 03/14/16 10:15 AM  Result Value Ref Range   Color, UA yellow yellow   Clarity, UA cloudy (A) clear   Glucose, UA negative  negative   Bilirubin, UA negative negative   Ketones, POC UA negative negative   Spec Grav, UA 1.015    Blood, UA small (A) negative   pH, UA 5.5    Protein Ur, POC =30 (A) negative   Urobilinogen, UA 0.2    Nitrite, UA Negative Negative   Leukocytes, UA large (3+) (A) Negative  POCT Microscopic Urinalysis ([facility])     Status: Abnormal   Collection Time: 03/14/16 10:15 AM  Result Value Ref Range   WBC,UR,HPF,POC Too numerous to count  (A) None WBC/hpf   RBC,UR,HPF,POC Too numerous to count  (A) None RBC/hpf   Bacteria Few (A) None, Too numerous to count   Mucus Absent Absent   Epithelial Cells, UR Per Microscopy Few (A) None, Too numerous to count cells/hpf   Assessment and Plan :   1. Vaginal discharge 2. Abnormal urinalysis - Patient is agreeable to using Flagyl vaginal inserts for 5 days. STI testing, labs pending. Will rtc in 2 days if symptoms persist.  3. Hyperlipidemia, unspecified hyperlipidemia type 4. Nausea without vomiting 5. Decreased appetite - Based off of her last lipid panel, I will have patient decrease her pravastatin to 20mg  (1/2 tablet) daily. If this does not help her nausea and decreased appetite, consider switching to atorvastatin.  Jane Eagles, PA-C [facility] [phone] 03/14/2016 10:04 AM

## 2016-03-14 NOTE — Patient Instructions (Addendum)
Metronidazole vaginal gel What is this medicine? METRONIDAZOLE (me troe NI da zole) VAGINAL GEL is an antiinfective. It is used to treat bacterial vaginitis. This medicine may be used for other purposes; ask your health care provider or pharmacist if you have questions. What should I tell my health care provider before I take this medicine? They need to know if you have any of these conditions: -if you drink alcohol containing drinks -if you have taken disulfiram in the past two weeks -liver disease -peripheral neuropathy -seizures -an unusual or allergic reaction to metronidazole, parabens, nitroimidazoles, or other medicines, foods, dyes, or preservatives -pregnant or trying to get pregnant -breast-feeding How should I use this medicine? This medicine is only for use in the vagina. Do not take by mouth or apply to other areas of the body. Follow the directions on the prescription label. Wash hands before and after use. Screw the applicator to the tube and squeeze the tube gently to fill the applicator. Lie on your back, part and bend your knees. Insert the applicator tip high in the vagina and push the plunger to release the gel into the vagina. Gently remove the applicator. Wash the applicator well with warm water and soap. Use at regular intervals. Finish the full course prescribed by your doctor or health care professional even if you think your condition is better. Do not stop using except on the advice of your doctor or health care professional. Talk to your pediatrician regarding the use of this medicine in children. Special care may be needed. Overdosage: If you think you have taken too much of this medicine contact a poison control center or emergency room at once. NOTE: This medicine is only for you. Do not share this medicine with others. What if I miss a dose? If you miss a dose, use it as soon as you can. If it is almost time for your next dose, use only that dose. Do not use double  or extra doses. What may interact with this medicine? Do not take this medicine with any of the following medications: -alcohol or any product that contains alcohol -cisapride -dofetilide -dronedarone -pimozide -thioridazine -ziprasidone This medicine may also interact with the following medications: -cimetidine -lithium -other medicines that prolong the QT interval (cause an abnormal heart rhythm) -warfarin This list may not describe all possible interactions. Give your health care provider a list of all the medicines, herbs, non-prescription drugs, or dietary supplements you use. Also tell them if you smoke, drink alcohol, or use illegal drugs. Some items may interact with your medicine. What should I watch for while using this medicine? Tell your doctor or health care professional if your symptoms do not start to get better in 2 or 3 days. Avoid alcoholic drinks while you are taking this medicine and for three days afterwards. Alcohol may make you feel dizzy, sick, or flushed. You may get drowsy or dizzy. Do not drive, use machinery, or do anything that needs mental alertness until you know how this medicine affects you. To reduce the risk of dizzy or fainting spells, do not sit or stand up quickly, especially if you are an older patient. Your clothing may get soiled if you have a vaginal discharge. You can wear a sanitary napkin. Do not use tampons. Wear freshly washed cotton, not synthetic, panties. Do not have sex until you have finished your treatment. Having sex can make the treatment less effective. Your sexual partner may also need treatment. What side effects may I  notice from receiving this medicine? Side effects that you should report to your doctor or health care professional as soon as possible: -dizziness -frequent passing of urine -headache -loss of appetite -nausea -skin rash, itching -stomach pain or cramps -vaginal irritation or discharge -vulvar burning or  swelling Side effects that usually do not require medical attention (report to your doctor or health care professional if they continue or are bothersome): -dark urine -mild vaginal burning This list may not describe all possible side effects. Call your doctor for medical advice about side effects. You may report side effects to FDA at 1-800-FDA-1088. Where should I keep my medicine? Keep out of the reach of children. Store at room temperature between 15 and 30 degrees C (59 and 86 degrees F). Do not freeze. Throw away any unused medicine after the expiration date. NOTE: This sheet is a summary. It may not cover all possible information. If you have questions about this medicine, talk to your doctor, pharmacist, or health care provider.    2016, [employer]vier/Gold Standard. (2012-12-09 14:09:23)    Take 1/2 tablet of pravastatin and plan to recheck your cholesterol in 6 months.  Pravastatin tablets What is this medicine? PRAVASTATIN (PRA va stat in) is known as a HMG-CoA reductase inhibitor or 'statin'. It lowers the level of cholesterol and triglycerides in the blood. This drug may also reduce the risk of heart attack, stroke, or other health problems in patients with risk factors for heart disease. Diet and lifestyle changes are often used with this drug. This medicine may be used for other purposes; ask your health care provider or pharmacist if you have questions. What should I tell my health care provider before I take this medicine? They need to know if you have any of these conditions: -frequently drink alcoholic beverages -kidney disease -liver disease -muscle aches or weakness -other medical condition -an unusual or allergic reaction to pravastatin, other medicines, foods, dyes, or preservatives -pregnant or trying to get pregnant -breast-feeding How should I use this medicine? Take pravastatin tablets by mouth. Swallow the tablets with a drink of water. Pravastatin can be taken  at anytime of the day, with or without food. Follow the directions on the prescription label. Take your doses at regular intervals. Do not take your medicine more often than directed. Talk to your pediatrician regarding the use of this medicine in children. Special care may be needed. Pravastatin has been used in children as young as 74 years of age. Overdosage: If you think you have taken too much of this medicine contact a poison control center or emergency room at once. NOTE: This medicine is only for you. Do not share this medicine with others. What if I miss a dose? If you miss a dose, take it as soon as you can. If it is almost time for your next dose, take only that dose. Do not take double or extra doses. What may interact with this medicine? Do not take this medicine with any of the following medications: -herbal medicines such as red yeast rice This medicine may also interact with the following medications: -alcohol -antiviral medicines for HIV or AIDS -certain medicines for fungal infections like ketoconazole and itraconazole -colchicine -cyclosporine -other medicines for high cholesterol -some antibiotics like clarithromycin, erythromycin, and telithromycin This list may not describe all possible interactions. Give your health care provider a list of all the medicines, herbs, non-prescription drugs, or dietary supplements you use. Also tell them if you smoke, drink alcohol, or use illegal  drugs. Some items may interact with your medicine. What should I watch for while using this medicine? Visit your doctor or health care professional for regular check-ups. You may need regular tests to make sure your liver is working properly. Tell your doctor or health care professional right away if you get any unexplained muscle pain, tenderness, or weakness, especially if you also have a fever and tiredness. Your doctor or health care professional may tell you to stop taking this medicine if you  develop muscle problems. If your muscle problems do not go away after stopping this medicine, contact your health care professional. This drug is only part of a total heart-health program. Your doctor or a dietician can suggest a low-cholesterol and low-fat diet to help. Avoid alcohol and smoking, and keep a proper exercise schedule. Do not use this drug if you are pregnant or breast-feeding. Serious side effects to an unborn child or to an infant are possible. Talk to your doctor or pharmacist for more information. This medicine may affect blood sugar levels. If you have diabetes, check with your doctor or health care professional before you change your diet or the dose of your diabetic medicine. If you are going to have surgery tell your health care professional that you are taking this drug. What side effects may I notice from receiving this medicine? Side effects that you should report to your doctor or health care professional as soon as possible: -allergic reactions like skin rash, itching or hives, swelling of the face, lips, or tongue -dark urine -fever -muscle pain, cramps, or weakness -redness, blistering, peeling or loosening of the skin, including inside the mouth -trouble passing urine or change in the amount of urine -unusually weak or tired -yellowing of the eyes or skin Side effects that usually do not require medical attention (report to your doctor or health care professional if they continue or are bothersome): -gas -headache -heartburn -indigestion -stomach pain This list may not describe all possible side effects. Call your doctor for medical advice about side effects. You may report side effects to FDA at 1-800-FDA-1088. Where should I keep my medicine? Keep out of the reach of children. Store at room temperature between 15 to 30 degrees C (59 to 86 degrees F). Protect from light. Keep container tightly closed. Throw away any unused medicine after the expiration  date. NOTE: This sheet is a summary. It may not cover all possible information. If you have questions about this medicine, talk to your doctor, pharmacist, or health care provider.    2016, [employer] Standard. (2011-03-24 10:39:37)

## 2016-03-15 LAB — URINE CULTURE: Organism ID, Bacteria: NO GROWTH

## 2016-03-16 LAB — HIV ANTIBODY (ROUTINE TESTING W REFLEX): HIV 1&2 Ab, 4th Generation: NONREACTIVE

## 2016-03-17 ENCOUNTER — Other Ambulatory Visit: Payer: Self-pay | Admitting: Urgent Care

## 2016-03-17 LAB — GC/CHLAMYDIA PROBE AMP
CT Probe RNA: NOT DETECTED
GC PROBE AMP APTIMA: NOT DETECTED

## 2016-03-17 LAB — SURESWAB, T.VAGINALIS RNA,QL,FEMALE: TRICHOMONAS VAGINALIS RNA: DETECTED — AB

## 2016-03-17 LAB — RPR

## 2016-03-17 MED ORDER — METRONIDAZOLE 500 MG PO TABS: 500.0000 mg | ORAL_TABLET | Freq: Two times a day (BID) | ORAL | 0 refills | Status: DC

## 2016-03-17 NOTE — Progress Notes (Signed)
Called and left message reporting positive trichomonas test. All other tests were negative. I let patient know that I would send her script to her pharmacy for treatment.

## 2016-04-01 ENCOUNTER — Other Ambulatory Visit: Payer: Self-pay | Admitting: Family Medicine

## 2016-04-01 DIAGNOSIS — D509 Iron deficiency anemia, unspecified: Secondary | ICD-10-CM

## 2016-04-01 DIAGNOSIS — N189 Chronic kidney disease, unspecified: Secondary | ICD-10-CM

## 2017-01-01 LAB — HM PAP SMEAR: HPV, high-risk: NEGATIVE

## 2017-02-04 ENCOUNTER — Ambulatory Visit (INDEPENDENT_AMBULATORY_CARE_PROVIDER_SITE_OTHER): Payer: 59 | Admitting: Family Medicine

## 2017-02-04 ENCOUNTER — Encounter: Payer: Self-pay | Admitting: Family Medicine

## 2017-02-04 VITALS — BP 118/75 | HR 78 | Temp 98.2°F | Resp 18 | Ht 63.0 in | Wt 185.6 lb

## 2017-02-04 DIAGNOSIS — N189 Chronic kidney disease, unspecified: Secondary | ICD-10-CM | POA: Diagnosis not present

## 2017-02-04 DIAGNOSIS — Z1389 Encounter for screening for other disorder: Secondary | ICD-10-CM

## 2017-02-04 DIAGNOSIS — I10 Essential (primary) hypertension: Secondary | ICD-10-CM | POA: Diagnosis not present

## 2017-02-04 DIAGNOSIS — Z1211 Encounter for screening for malignant neoplasm of colon: Secondary | ICD-10-CM

## 2017-02-04 DIAGNOSIS — Z130 Encounter for screening for diseases of the blood and blood-forming organs and certain disorders involving the immune mechanism: Secondary | ICD-10-CM | POA: Diagnosis not present

## 2017-02-04 DIAGNOSIS — Z113 Encounter for screening for infections with a predominantly sexual mode of transmission: Secondary | ICD-10-CM | POA: Diagnosis not present

## 2017-02-04 DIAGNOSIS — E119 Type 2 diabetes mellitus without complications: Secondary | ICD-10-CM

## 2017-02-04 DIAGNOSIS — E7800 Pure hypercholesterolemia, unspecified: Secondary | ICD-10-CM

## 2017-02-04 DIAGNOSIS — Z1231 Encounter for screening mammogram for malignant neoplasm of breast: Secondary | ICD-10-CM | POA: Diagnosis not present

## 2017-02-04 DIAGNOSIS — Z136 Encounter for screening for cardiovascular disorders: Secondary | ICD-10-CM

## 2017-02-04 DIAGNOSIS — Z124 Encounter for screening for malignant neoplasm of cervix: Secondary | ICD-10-CM

## 2017-02-04 DIAGNOSIS — E559 Vitamin D deficiency, unspecified: Secondary | ICD-10-CM | POA: Diagnosis not present

## 2017-02-04 DIAGNOSIS — Z1383 Encounter for screening for respiratory disorder NEC: Secondary | ICD-10-CM

## 2017-02-04 DIAGNOSIS — Z1212 Encounter for screening for malignant neoplasm of rectum: Secondary | ICD-10-CM | POA: Diagnosis not present

## 2017-02-04 DIAGNOSIS — Z1329 Encounter for screening for other suspected endocrine disorder: Secondary | ICD-10-CM | POA: Diagnosis not present

## 2017-02-04 DIAGNOSIS — Z0000 Encounter for general adult medical examination without abnormal findings: Secondary | ICD-10-CM

## 2017-02-04 DIAGNOSIS — N1831 Chronic kidney disease, stage 3a: Secondary | ICD-10-CM | POA: Insufficient documentation

## 2017-02-04 LAB — POCT URINALYSIS DIP (MANUAL ENTRY)
BILIRUBIN UA: NEGATIVE
Blood, UA: NEGATIVE
Ketones, POC UA: NEGATIVE mg/dL
LEUKOCYTES UA: NEGATIVE
Nitrite, UA: NEGATIVE
PH UA: 5.5 (ref 5.0–8.0)
Protein Ur, POC: 30 mg/dL — AB
Spec Grav, UA: 1.02 (ref 1.010–1.025)
Urobilinogen, UA: 0.2 E.U./dL

## 2017-02-04 LAB — POCT GLYCOSYLATED HEMOGLOBIN (HGB A1C): Hemoglobin A1C: 8.5

## 2017-02-04 MED ORDER — METFORMIN HCL 500 MG PO TABS: 500.0000 mg | ORAL_TABLET | Freq: Two times a day (BID) | ORAL | 0 refills | Status: DC

## 2017-02-04 MED ORDER — METFORMIN HCL 1000 MG PO TABS
1000.0000 mg | ORAL_TABLET | Freq: Two times a day (BID) | ORAL | 1 refills | Status: DC
Start: 2017-02-04 — End: 2017-07-04

## 2017-02-04 MED ORDER — ATORVASTATIN CALCIUM 10 MG PO TABS: 10.0000 mg | ORAL_TABLET | Freq: Every day | ORAL | 0 refills | Status: DC

## 2017-02-04 NOTE — Patient Instructions (Addendum)
   IF you received an x-ray today, you will receive an invoice from [facility]. Please contact [facility] at [phone] with questions or concerns regarding your invoice.   IF you received labwork today, you will receive an invoice from [facility]. Please contact [facility] at [phone] with questions or concerns regarding your invoice.   Our billing staff will not be able to assist you with questions regarding bills from these companies.  You will be contacted with the lab results as soon as they are available. The fastest way to get your results is to activate your My Chart account. Instructions are located on the last page of this paperwork. If you have not heard from us regarding the results in 2 weeks, please contact this office.      Diabetes Mellitus and Standards of Medical Care Managing diabetes (diabetes mellitus) can be complicated. Your diabetes treatment may be managed by a team of health care providers, including:  A diet and nutrition specialist (registered dietitian).  A nurse.  A certified diabetes educator (CDE).  A diabetes specialist (endocrinologist).  An eye doctor.  A primary care provider.  A dentist.  Your health care providers follow a schedule in order to help you get the best quality of care. The following schedule is a general guideline for your diabetes management plan. Your health care providers may also give you more specific instructions. HbA1c ( hemoglobin A1c) test This test provides information about blood sugar (glucose) control over the previous 2-3 months. It is used to check whether your diabetes management plan needs to be adjusted.  If you are meeting your treatment goals, this test is done at least 2 times a year.  If you are not meeting treatment goals or if your treatment goals have changed, this test is done 4 times a year.  Blood pressure test  This test is done at every routine medical visit. For most  people, the goal is less than 130/80. Ask your health care provider what your goal blood pressure should be. Dental and eye exams  Visit your dentist two times a year.  If you have type 1 diabetes, get an eye exam 3-5 years after you are diagnosed, and then once a year after your first exam. ? If you were diagnosed with type 1 diabetes as a child, get an eye exam when you are age 10 or older and have had diabetes for 3-5 years. After the first exam, you should get an eye exam once a year.  If you have type 2 diabetes, have an eye exam as soon as you are diagnosed, and then once a year after your first exam. Foot care exam  Visual foot exams are done at every routine medical visit. The exams check for cuts, bruises, redness, blisters, sores, or other problems with the feet.  A complete foot exam is done by your health care provider once a year. This exam includes an inspection of the structure and skin of your feet, and a check of the pulses and sensation in your feet. ? Type 1 diabetes: Get your first exam 3-5 years after diagnosis. ? Type 2 diabetes: Get your first exam as soon as you are diagnosed.  Check your feet every day for cuts, bruises, redness, blisters, or sores. If you have any of these or other problems that are not healing, contact your health care provider. Kidney function test ( urine microalbumin)  This test is done once a year. ? Type 1 diabetes:   your first test 5 years after diagnosis. ? Type 2 diabetes: Get your first test as soon as you are diagnosed.  If you have chronic kidney disease (CKD), get a serum creatinine and estimated glomerular filtration rate (eGFR) test once a year. Lipid profile (cholesterol, HDL, LDL, triglycerides)  This test should be done when you are diagnosed with diabetes, and every 5 years after the first test. If you are on medicines to lower your cholesterol, you may need to get this test done every year. ? The goal for LDL is less than  100 mg/dL (5.5 mmol/L). If you are at high risk, the goal is less than 70 mg/dL (3.9 mmol/L). ? The goal for HDL is 40 mg/dL (2.2 mmol/L) for men and 50 mg/dL(2.8 mmol/L) for women. An HDL cholesterol of 60 mg/dL (3.3 mmol/L) or higher gives some protection against heart disease. ? The goal for triglycerides is less than 150 mg/dL (8.3 mmol/L). Immunizations  The yearly flu (influenza) vaccine is recommended for everyone 6 months or older who has diabetes.  The pneumonia (pneumococcal) vaccine is recommended for everyone 2 years or older who has diabetes. If you are 37 or older, you may get the pneumonia vaccine as a series of two separate shots.  The hepatitis B vaccine is recommended for adults shortly after they have been diagnosed with diabetes.  The Tdap (tetanus, diphtheria, and pertussis) vaccine should be given: ? According to normal childhood vaccination schedules, for children. ? Every 10 years, for adults who have diabetes.  The shingles vaccine is recommended for people who have had chicken pox and are 50 years or older. Mental and emotional health  Screening for symptoms of eating disorders, anxiety, and depression is recommended at the time of diagnosis and afterward as needed. If your screening shows that you have symptoms (you have a positive screening result), you may need further evaluation and be referred to a mental health care provider. Diabetes self-management education  Education about how to manage your diabetes is recommended at diagnosis and ongoing as needed. Treatment plan  Your treatment plan will be reviewed at every medical visit. Summary  Managing diabetes (diabetes mellitus) can be complicated. Your diabetes treatment may be managed by a team of health care providers.  Your health care providers follow a schedule in order to help you get the best quality of care.  Standards of care including having regular physical exams, blood tests, blood pressure  monitoring, immunizations, screening tests, and education about how to manage your diabetes.  Your health care providers may also give you more specific instructions based on your individual health. This information is not intended to replace advice given to you by your health care provider. Make sure you discuss any questions you have with your health care provider. Document Released: 03/01/2009 Document Revised: 01/31/2016 Document Reviewed: 01/31/2016 Elsevier Interactive Patient Education  2018 [employer].   Diabetes Mellitus and Food It is important for you to manage your blood sugar (glucose) level. Your blood glucose level can be greatly affected by what you eat. Eating healthier foods in the appropriate amounts throughout the day at about the same time each day will help you control your blood glucose level. It can also help slow or prevent worsening of your diabetes mellitus. Healthy eating may even help you improve the level of your blood pressure and reach or maintain a healthy weight. General recommendations for healthful eating and cooking habits include:  Eating meals and snacks regularly. Avoid going long  periods of time without eating to lose weight.  Eating a diet that consists mainly of plant-based foods, such as fruits, vegetables, nuts, legumes, and whole grains.  Using low-heat cooking methods, such as baking, instead of high-heat cooking methods, such as deep frying.  Work with your dietitian to make sure you understand how to use the Nutrition Facts information on food labels. How can food affect me? Carbohydrates Carbohydrates affect your blood glucose level more than any other type of food. Your dietitian will help you determine how many carbohydrates to eat at each meal and teach you how to count carbohydrates. Counting carbohydrates is important to keep your blood glucose at a healthy level, especially if you are using insulin or taking certain medicines for  diabetes mellitus. Alcohol Alcohol can cause sudden decreases in blood glucose (hypoglycemia), especially if you use insulin or take certain medicines for diabetes mellitus. Hypoglycemia can be a life-threatening condition. Symptoms of hypoglycemia (sleepiness, dizziness, and disorientation) are similar to symptoms of having too much alcohol. If your health care provider has given you approval to drink alcohol, do so in moderation and use the following guidelines:  Women should not have more than one drink per day, and men should not have more than two drinks per day. One drink is equal to: ? 12 oz of beer. ? 5 oz of wine. ? 1 oz of hard liquor.  Do not drink on an empty stomach.  Keep yourself hydrated. Have water, diet soda, or unsweetened iced tea.  Regular soda, juice, and other mixers might contain a lot of carbohydrates and should be counted.  What foods are not recommended? As you make food choices, it is important to remember that all foods are not the same. Some foods have fewer nutrients per serving than other foods, even though they might have the same number of calories or carbohydrates. It is difficult to get your body what it needs when you eat foods with fewer nutrients. Examples of foods that you should avoid that are high in calories and carbohydrates but low in nutrients include:  Trans fats (most processed foods list trans fats on the Nutrition Facts label).  Regular soda.  Juice.  Candy.  Sweets, such as cake, pie, doughnuts, and cookies.  Fried foods.  What foods can I eat? Eat nutrient-rich foods, which will nourish your body and keep you healthy. The food you should eat also will depend on several factors, including:  The calories you need.  The medicines you take.  Your weight.  Your blood glucose level.  Your blood pressure level.  Your cholesterol level.  You should eat a variety of foods, including:  Protein. ? Lean cuts of  meat. ? Proteins low in saturated fats, such as fish, egg whites, and beans. Avoid processed meats.  Fruits and vegetables. ? Fruits and vegetables that may help control blood glucose levels, such as apples, mangoes, and yams.  Dairy products. ? Choose fat-free or low-fat dairy products, such as milk, yogurt, and cheese.  Grains, bread, pasta, and rice. ? Choose whole grain products, such as multigrain bread, whole oats, and brown rice. These foods may help control blood pressure.  Fats. ? Foods containing healthful fats, such as nuts, avocado, olive oil, canola oil, and fish.  Does everyone with diabetes mellitus have the same meal plan? Because every person with diabetes mellitus is different, there is not one meal plan that works for everyone. It is very important that you meet with a dietitian  who will help you create a meal plan that is just right for you. This information is not intended to replace advice given to you by your health care provider. Make sure you discuss any questions you have with your health care provider. Document Released: 01/29/2005 Document Revised: 10/10/2015 Document Reviewed: 03/31/2013 Elsevier Interactive Patient Education  2017 [employer].
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
## 2017-02-05 LAB — COMPREHENSIVE METABOLIC PANEL
A/G RATIO: 1.8 (ref 1.2–2.2)
ALK PHOS: 74 IU/L (ref 39–117)
ALT: 29 IU/L (ref 0–32)
AST: 28 IU/L (ref 0–40)
Albumin: 4.8 g/dL (ref 3.6–4.8)
BILIRUBIN TOTAL: 0.5 mg/dL (ref 0.0–1.2)
BUN / CREAT RATIO: 17 (ref 12–28)
BUN: 18 mg/dL (ref 8–27)
CALCIUM: 10.7 mg/dL — AB (ref 8.7–10.3)
CHLORIDE: 98 mmol/L (ref 96–106)
CO2: 26 mmol/L (ref 20–29)
Creatinine, Ser: 1.08 mg/dL — ABNORMAL HIGH (ref 0.57–1.00)
GFR calc non Af Amer: 55 mL/min/{1.73_m2} — ABNORMAL LOW (ref >59)
GFR, EST AFRICAN AMERICAN: 63 mL/min/{1.73_m2} (ref >59)
GLOBULIN, TOTAL: 2.7 g/dL (ref 1.5–4.5)
Glucose: 157 mg/dL — ABNORMAL HIGH (ref 65–99)
POTASSIUM: 3.9 mmol/L (ref 3.5–5.2)
SODIUM: 142 mmol/L (ref 134–144)
Total Protein: 7.5 g/dL (ref 6.0–8.5)

## 2017-02-05 LAB — LIPID PANEL
CHOL/HDL RATIO: 5 ratio — AB (ref 0.0–4.4)
Cholesterol, Total: 238 mg/dL — ABNORMAL HIGH (ref 100–199)
HDL: 48 mg/dL (ref >39)
LDL CALC: 168 mg/dL — AB (ref 0–99)
Triglycerides: 112 mg/dL (ref 0–149)
VLDL Cholesterol Cal: 22 mg/dL (ref 5–40)

## 2017-02-05 LAB — CBC WITH DIFFERENTIAL/PLATELET
BASOS: 1 %
Basophils Absolute: 0 10*3/uL (ref 0.0–0.2)
EOS (ABSOLUTE): 0.2 10*3/uL (ref 0.0–0.4)
Eos: 2 %
HEMATOCRIT: 43.4 % (ref 34.0–46.6)
HEMOGLOBIN: 15.2 g/dL (ref 11.1–15.9)
IMMATURE GRANS (ABS): 0 10*3/uL (ref 0.0–0.1)
Immature Granulocytes: 0 %
Lymphocytes Absolute: 2.2 10*3/uL (ref 0.7–3.1)
Lymphs: 33 %
MCH: 30.8 pg (ref 26.6–33.0)
MCHC: 35 g/dL (ref 31.5–35.7)
MCV: 88 fL (ref 79–97)
MONOS ABS: 0.3 10*3/uL (ref 0.1–0.9)
Monocytes: 4 %
NEUTROS PCT: 60 %
Neutrophils Absolute: 3.9 10*3/uL (ref 1.4–7.0)
Platelets: 257 10*3/uL (ref 150–379)
RBC: 4.93 x10E6/uL (ref 3.77–5.28)
RDW: 13.8 % (ref 12.3–15.4)
WBC: 6.6 10*3/uL (ref 3.4–10.8)

## 2017-02-05 LAB — TSH: TSH: 2.49 u[IU]/mL (ref 0.450–4.500)

## 2017-02-05 LAB — VITAMIN D 25 HYDROXY (VIT D DEFICIENCY, FRACTURES): Vit D, 25-Hydroxy: 21.9 ng/mL — ABNORMAL LOW (ref 30.0–100.0)

## 2017-02-06 LAB — MICROALBUMIN / CREATININE URINE RATIO
CREATININE, UR: 177.2 mg/dL
Microalb/Creat Ratio: 17.4 mg/g creat (ref 0.0–30.0)
Microalbumin, Urine: 30.9 ug/mL

## 2017-03-03 ENCOUNTER — Other Ambulatory Visit: Payer: Self-pay | Admitting: Family Medicine

## 2017-04-15 ENCOUNTER — Encounter: Payer: 59 | Attending: Family Medicine | Admitting: Dietician

## 2017-04-15 ENCOUNTER — Encounter: Payer: Self-pay | Admitting: Dietician

## 2017-04-15 DIAGNOSIS — Z713 Dietary counseling and surveillance: Secondary | ICD-10-CM | POA: Diagnosis not present

## 2017-04-15 DIAGNOSIS — E119 Type 2 diabetes mellitus without complications: Secondary | ICD-10-CM | POA: Insufficient documentation

## 2017-04-15 NOTE — Progress Notes (Signed)

## 2017-04-22 ENCOUNTER — Encounter: Payer: 59 | Attending: Family Medicine | Admitting: Dietician

## 2017-04-22 DIAGNOSIS — E119 Type 2 diabetes mellitus without complications: Secondary | ICD-10-CM | POA: Diagnosis not present

## 2017-04-22 DIAGNOSIS — Z713 Dietary counseling and surveillance: Secondary | ICD-10-CM | POA: Diagnosis present

## 2017-04-22 NOTE — Progress Notes (Signed)
Patient was seen on 04/22/17 for the second of a series of three diabetes self-management courses at the [facility]. The following learning objectives were met by the patient during this class:   Describe the role of different macronutrients on glucose  Explain how carbohydrates affect blood glucose  State what foods contain the most carbohydrates  Demonstrate carbohydrate counting  Demonstrate how to read Nutrition Facts food label  Describe effects of various fats on heart health  Describe the importance of good nutrition for health and healthy eating strategies  Describe techniques for managing your shopping, cooking and meal planning  List strategies to follow meal plan when dining out  Describe the effects of alcohol on glucose and how to use it safely  Goals:  Follow Diabetes Meal Plan as instructed  Aim to spread carbs evenly throughout the day  Aim for 3 meals per day and snacks as needed Include lean protein foods to meals/snacks  Monitor glucose levels as instructed by your doctor   Follow-Up Plan:  Attend Core 3  Work towards following your personal food plan.

## 2017-04-29 ENCOUNTER — Encounter: Payer: 59 | Admitting: *Deleted

## 2017-04-29 DIAGNOSIS — Z713 Dietary counseling and surveillance: Secondary | ICD-10-CM | POA: Diagnosis not present

## 2017-04-29 DIAGNOSIS — E119 Type 2 diabetes mellitus without complications: Secondary | ICD-10-CM

## 2017-04-29 NOTE — Progress Notes (Signed)
Patient was seen on 04/29/2017 for the third of a series of three diabetes self-management courses at the [facility].   Jane Pena the amount of activity recommended for healthy living . Describe activities suitable for individual needs . Identify ways to regularly incorporate activity into daily life . Identify barriers to activity and ways to over come these barriers  Identify diabetes medications being personally used and their primary action for lowering glucose and possible side effects . Describe role of stress on blood glucose and develop strategies to address psychosocial issues . Identify diabetes complications and ways to prevent them  Explain how to manage diabetes during illness . Evaluate success in meeting personal goal . Establish 2-3 goals that they will plan to diligently work on until they return for the  47-month follow-up visit  Goals:   I will count my carb choices at most meals and snacks  I will be active 30 minutes or more 4 times a week  I will take my diabetes medications as scheduled  Your patient has identified these potential barriers to change:  Motivation  Your patient has identified their diabetes self-care support plan as  Family Support On-line Resources   Plan:  Attend Support Group as desired

## 2017-07-04 ENCOUNTER — Other Ambulatory Visit: Payer: Self-pay | Admitting: Family Medicine

## 2017-07-08 ENCOUNTER — Other Ambulatory Visit: Payer: Self-pay

## 2017-07-08 ENCOUNTER — Ambulatory Visit (INDEPENDENT_AMBULATORY_CARE_PROVIDER_SITE_OTHER): Payer: Managed Care, Other (non HMO) | Admitting: Family Medicine

## 2017-07-08 ENCOUNTER — Encounter: Payer: Self-pay | Admitting: Family Medicine

## 2017-07-08 VITALS — BP 94/62 | HR 85 | Temp 98.1°F | Resp 16 | Ht 63.0 in | Wt 179.4 lb

## 2017-07-08 DIAGNOSIS — E559 Vitamin D deficiency, unspecified: Secondary | ICD-10-CM | POA: Diagnosis not present

## 2017-07-08 DIAGNOSIS — I10 Essential (primary) hypertension: Secondary | ICD-10-CM | POA: Diagnosis not present

## 2017-07-08 DIAGNOSIS — E119 Type 2 diabetes mellitus without complications: Secondary | ICD-10-CM

## 2017-07-08 DIAGNOSIS — Z5181 Encounter for therapeutic drug level monitoring: Secondary | ICD-10-CM | POA: Diagnosis not present

## 2017-07-08 DIAGNOSIS — E7800 Pure hypercholesterolemia, unspecified: Secondary | ICD-10-CM

## 2017-07-08 DIAGNOSIS — M791 Myalgia, unspecified site: Secondary | ICD-10-CM | POA: Diagnosis not present

## 2017-07-08 DIAGNOSIS — N182 Chronic kidney disease, stage 2 (mild): Secondary | ICD-10-CM

## 2017-07-08 DIAGNOSIS — E6609 Other obesity due to excess calories: Secondary | ICD-10-CM

## 2017-07-08 DIAGNOSIS — Z6831 Body mass index (BMI) 31.0-31.9, adult: Secondary | ICD-10-CM

## 2017-07-08 LAB — POCT GLYCOSYLATED HEMOGLOBIN (HGB A1C): Hemoglobin A1C: 6.8

## 2017-07-08 MED ORDER — AMLODIPINE-OLMESARTAN 10-40 MG PO TABS: 1.0000 | ORAL_TABLET | Freq: Every day | ORAL | 1 refills | Status: DC

## 2017-07-08 MED ORDER — BLOOD GLUCOSE MONITOR KIT: PACK | 0 refills | Status: DC
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
## 2017-07-08 NOTE — Patient Instructions (Addendum)
Start taking vitamin D 5000u a day (minimum of 2000u/d).   IF you received an x-ray today, you will receive an invoice from [facility]. Please contact [facility] at [phone] with questions or concerns regarding your invoice.   IF you received labwork today, you will receive an invoice from [place]. Please contact [facility] at [phone] with questions or concerns regarding your invoice.   Our billing staff will not be able to assist you with questions regarding bills from these companies.  You will be contacted with the lab results as soon as they are available. The fastest way to get your results is to activate your My Chart account. Instructions are located on the last page of this paperwork. If you have not heard from [place] regarding the results in 2 weeks, please contact this office.     Exercising to Lose Weight Exercising can help you to lose weight. In order to lose weight through exercise, you need to do vigorous-intensity exercise. You can tell that you are exercising with vigorous intensity if you are breathing very hard and fast and cannot hold a conversation while exercising. Moderate-intensity exercise helps to maintain your current weight. You can tell that you are exercising at a moderate level if you have a higher heart rate and faster breathing, but you are still able to hold a conversation. How often should I exercise? Choose an activity that you enjoy and set realistic goals. Your health care provider can help you to make an activity plan that works for you. Exercise regularly as directed by your health care provider. This may include:  Doing resistance training twice each week, such as: ? Push-ups. ? Sit-ups. ? Lifting weights. ? Using resistance bands.  Doing a given intensity of exercise for a given amount of time. Choose from these options: ? 150 minutes of moderate-intensity exercise every week. ? 75 minutes of vigorous-intensity exercise  every week. ? A mix of moderate-intensity and vigorous-intensity exercise every week.  Children, pregnant women, people who are out of shape, people who are overweight, and older adults may need to consult a health care provider for individual recommendations. If you have any sort of medical condition, be sure to consult your health care provider before starting a new exercise program. What are some activities that can help me to lose weight?  Walking at a rate of at least 4.5 miles an hour.  Jogging or running at a rate of 5 miles per hour.  Biking at a rate of at least 10 miles per hour.  Lap swimming.  Roller-skating or in-line skating.  Cross-country skiing.  Vigorous competitive sports, such as football, basketball, and soccer.  Jumping rope.  Aerobic dancing. How can I be more active in my day-to-day activities?  Use the stairs instead of the elevator.  Take a walk during your lunch break.  If you drive, park your car farther away from work or school.  If you take public transportation, get off one stop early and walk the rest of the way.  Make all of your phone calls while standing up and walking around.  Get up, stretch, and walk around every 30 minutes throughout the day. What guidelines should I follow while exercising?  Do not exercise so much that you hurt yourself, feel dizzy, or get very short of breath.  Consult your health care provider prior to starting a new exercise program.  Wear comfortable clothes and shoes with good support.  Drink plenty of water while you exercise  to prevent dehydration or heat stroke. Body water is lost during exercise and must be replaced.  Work out until you breathe faster and your heart beats faster. This information is not intended to replace advice given to you by your health care provider. Make sure you discuss any questions you have with your health care provider. Document Released: 06/06/2010 Document Revised:  10/10/2015 Document Reviewed: 10/05/2013 Elsevier Interactive Patient Education  [employer].

## 2017-07-09 LAB — COMPREHENSIVE METABOLIC PANEL
ALT: 31 IU/L (ref 0–32)
AST: 26 IU/L (ref 0–40)
Albumin/Globulin Ratio: 1.8 (ref 1.2–2.2)
Albumin: 4.7 g/dL (ref 3.6–4.8)
Alkaline Phosphatase: 69 IU/L (ref 39–117)
BUN/Creatinine Ratio: 19 (ref 12–28)
BUN: 25 mg/dL (ref 8–27)
Bilirubin Total: 0.2 mg/dL (ref 0.0–1.2)
CO2: 25 mmol/L (ref 20–29)
Calcium: 10.9 mg/dL — ABNORMAL HIGH (ref 8.7–10.3)
Chloride: 103 mmol/L (ref 96–106)
Creatinine, Ser: 1.32 mg/dL — ABNORMAL HIGH (ref 0.57–1.00)
GFR calc Af Amer: 50 mL/min/{1.73_m2} — ABNORMAL LOW (ref >59)
GFR calc non Af Amer: 43 mL/min/{1.73_m2} — ABNORMAL LOW (ref >59)
Globulin, Total: 2.6 g/dL (ref 1.5–4.5)
Glucose: 143 mg/dL — ABNORMAL HIGH (ref 65–99)
Potassium: 4.4 mmol/L (ref 3.5–5.2)
Sodium: 146 mmol/L — ABNORMAL HIGH (ref 134–144)
Total Protein: 7.3 g/dL (ref 6.0–8.5)

## 2017-07-09 LAB — LIPID PANEL
Chol/HDL Ratio: 3.2 ratio (ref 0.0–4.4)
Cholesterol, Total: 165 mg/dL (ref 100–199)
HDL: 51 mg/dL (ref >39)
LDL Calculated: 84 mg/dL (ref 0–99)
Triglycerides: 149 mg/dL (ref 0–149)
VLDL Cholesterol Cal: 30 mg/dL (ref 5–40)

## 2017-07-09 LAB — CK: CK TOTAL: 147 U/L (ref 24–173)

## 2017-07-14 ENCOUNTER — Encounter: Payer: Self-pay | Admitting: Family Medicine

## 2017-07-14 MED ORDER — ATORVASTATIN CALCIUM 10 MG PO TABS: 10.0000 mg | ORAL_TABLET | Freq: Every day | ORAL | 3 refills | Status: DC

## 2017-07-14 NOTE — Addendum Note (Signed)
Addended by: Shawnee Knapp on: 07/14/2017 11:43 PM   Modules accepted: Orders

## 2017-07-15 ENCOUNTER — Encounter: Payer: Self-pay | Admitting: Radiology

## 2017-08-24 ENCOUNTER — Ambulatory Visit (INDEPENDENT_AMBULATORY_CARE_PROVIDER_SITE_OTHER): Payer: Managed Care, Other (non HMO)

## 2017-08-24 ENCOUNTER — Encounter: Payer: Self-pay | Admitting: Emergency Medicine

## 2017-08-24 ENCOUNTER — Other Ambulatory Visit: Payer: Self-pay

## 2017-08-24 ENCOUNTER — Ambulatory Visit: Payer: Managed Care, Other (non HMO) | Admitting: Emergency Medicine

## 2017-08-24 VITALS — BP 112/78 | HR 87 | Temp 97.8°F | Resp 16 | Ht 63.0 in | Wt 179.2 lb

## 2017-08-24 DIAGNOSIS — M7551 Bursitis of right shoulder: Secondary | ICD-10-CM

## 2017-08-24 DIAGNOSIS — M25511 Pain in right shoulder: Secondary | ICD-10-CM

## 2017-08-24 DIAGNOSIS — M7581 Other shoulder lesions, right shoulder: Secondary | ICD-10-CM | POA: Diagnosis not present

## 2017-08-24 MED ORDER — TRAMADOL HCL 50 MG PO TABS: 50.0000 mg | ORAL_TABLET | Freq: Three times a day (TID) | ORAL | 0 refills | Status: DC | PRN

## 2017-08-24 NOTE — Patient Instructions (Addendum)
     IF you received an x-ray today, you will receive an invoice from [facility]. Please contact [facility] at [phone] with questions or concerns regarding your invoice.   IF you received labwork today, you will receive an invoice from [facility]. Please contact [facility] at [phone] with questions or concerns regarding your invoice.   Our billing staff will not be able to assist you with questions regarding bills from these companies.  You will be contacted with the lab results as soon as they are available. The fastest way to get your results is to activate your My Chart account. Instructions are located on the last page of this paperwork. If you have not heard from us regarding the results in 2 weeks, please contact this office.     Shoulder Pain Many things can cause shoulder pain, including:  An injury.  Moving the arm in the same way again and again (overuse).  Joint pain (arthritis).  Follow these instructions at home: Take these actions to help with your pain:  Squeeze a soft ball or a foam pad as much as you can. This helps to prevent swelling. It also makes the arm stronger.  Take over-the-counter and prescription medicines only as told by your doctor.  If told, put ice on the area: ? Put ice in a plastic bag. ? Place a towel between your skin and the bag. ? Leave the ice on for 20 minutes, 2-3 times per day. Stop putting on ice if it does not help with the pain.  If you were given a shoulder sling or immobilizer: ? Wear it as told. ? Remove it to shower or bathe. ? Move your arm as little as possible. ? Keep your hand moving. This helps prevent swelling.  Contact a doctor if:  Your pain gets worse.  Medicine does not help your pain.  You have new pain in your arm, hand, or fingers. Get help right away if:  Your arm, hand, or fingers: ? Tingle. ? Are numb. ? Are swollen. ? Are painful. ? Turn white or blue. This information is  not intended to replace advice given to you by your health care provider. Make sure you discuss any questions you have with your health care provider. Document Released: 10/21/2007 Document Revised: 12/29/2015 Document Reviewed: 08/27/2014 Elsevier Interactive Patient Education  2018 Elsevier Inc.  
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
## 2017-08-25 ENCOUNTER — Other Ambulatory Visit: Payer: Self-pay

## 2017-08-25 MED ORDER — ONETOUCH DELICA LANCETS 33G MISC: 30 refills | Status: DC

## 2017-08-25 MED ORDER — AMLODIPINE-OLMESARTAN 10-40 MG PO TABS: 1.0000 | ORAL_TABLET | Freq: Every day | ORAL | 1 refills | Status: DC

## 2017-08-25 MED ORDER — ATORVASTATIN CALCIUM 10 MG PO TABS: 10.0000 mg | ORAL_TABLET | Freq: Every day | ORAL | 3 refills | Status: DC

## 2017-09-15 ENCOUNTER — Other Ambulatory Visit: Payer: Self-pay

## 2017-09-15 ENCOUNTER — Telehealth: Payer: Self-pay | Admitting: Family Medicine

## 2017-09-15 MED ORDER — GLUCOSE BLOOD VI STRP: ORAL_STRIP | 3 refills | Status: DC

## 2017-09-15 NOTE — Telephone Encounter (Signed)
Pt states she uses Accucheck 2 meter. 30 day supply authorized by [employer] to pharmacy. Pt will need additional 30 day to hold her over until OV at the end of June. Please advise.

## 2017-09-15 NOTE — Telephone Encounter (Signed)
Copied from [place] [phone]. Topic: Quick Communication - Rx Refill/Question >> Sep 15, 2017 12:24 PM Lennox Solders wrote: Medication:one touch ultra blue test strips # 270 for 90 day supply Has the patient contacted their pharmacy? Yes (Agent: If no, request that the patient contact the pharmacy for the refill.) Preferred Pharmacy (with phone number or street name): [facility]  mail order

## 2017-09-15 NOTE — Telephone Encounter (Signed)
Yes - please pend appropriate order and send to me for sig - fine to give 90d supply #270with 3 refills

## 2017-09-22 ENCOUNTER — Telehealth: Payer: Self-pay | Admitting: Family Medicine

## 2017-09-22 NOTE — Telephone Encounter (Unsigned)
Copied from [place] [phone]. Topic: Quick Communication - See Telephone Encounter >> Sep 22, 2017 10:05 AM Vernona Rieger wrote: CRM for notification. See Telephone encounter for: 09/22/17.   [facility] needs clarification on glucose blood test strip.  Call back is [phone] reference number [id number]

## 2017-09-22 NOTE — Telephone Encounter (Signed)
[facility] contacted and state that they do not have strips for Accuview 2 glucometer. Pharmacy states that they carry strips such as accuview aviva or accuview smartview for example.  Attempted to call pt to verify the pt was using Accuview 2 monitor but not answer at this time. Left message on voicemail for the pt to call back to verify which glucometer is being used.

## 2017-09-22 NOTE — Telephone Encounter (Signed)
Copied from [place] [phone]. Topic: Quick Communication - See Telephone Encounter >> Sep 22, 2017 10:05 AM Vernona Rieger wrote: CRM for notification. See Telephone encounter for: 09/22/17.   [facility] needs clarification on glucose blood test strip.  Call back is [phone] reference number [id number]

## 2017-09-23 NOTE — Telephone Encounter (Signed)
LVM for patient. Asked which glucometer she is using and what the name of her test strips are. [facility] only carries accuview avivia and accuview smartview.

## 2017-10-12 ENCOUNTER — Encounter: Payer: Self-pay | Admitting: Family Medicine

## 2017-11-11 ENCOUNTER — Encounter: Payer: Self-pay | Admitting: Family Medicine

## 2017-11-11 ENCOUNTER — Ambulatory Visit: Payer: Managed Care, Other (non HMO) | Admitting: Family Medicine

## 2017-11-11 ENCOUNTER — Other Ambulatory Visit: Payer: Self-pay

## 2017-11-11 ENCOUNTER — Ambulatory Visit (INDEPENDENT_AMBULATORY_CARE_PROVIDER_SITE_OTHER): Payer: Managed Care, Other (non HMO)

## 2017-11-11 VITALS — BP 118/70 | HR 70 | Temp 98.2°F | Resp 16 | Ht 62.99 in | Wt 179.0 lb

## 2017-11-11 DIAGNOSIS — I10 Essential (primary) hypertension: Secondary | ICD-10-CM

## 2017-11-11 DIAGNOSIS — D509 Iron deficiency anemia, unspecified: Secondary | ICD-10-CM

## 2017-11-11 DIAGNOSIS — E1165 Type 2 diabetes mellitus with hyperglycemia: Secondary | ICD-10-CM

## 2017-11-11 DIAGNOSIS — Z79899 Other long term (current) drug therapy: Secondary | ICD-10-CM

## 2017-11-11 DIAGNOSIS — R42 Dizziness and giddiness: Secondary | ICD-10-CM

## 2017-11-11 DIAGNOSIS — E7800 Pure hypercholesterolemia, unspecified: Secondary | ICD-10-CM

## 2017-11-11 DIAGNOSIS — E559 Vitamin D deficiency, unspecified: Secondary | ICD-10-CM

## 2017-11-11 LAB — POC MICROSCOPIC URINALYSIS (UMFC): Mucus: ABSENT

## 2017-11-11 LAB — POCT CBC
GRANULOCYTE PERCENT: 63.4 % (ref 37–80)
HCT, POC: 46.4 % (ref 37.7–47.9)
HEMOGLOBIN: 15.2 g/dL (ref 12.2–16.2)
Lymph, poc: 1.8 (ref 0.6–3.4)
MCH: 30.5 pg (ref 27–31.2)
MCHC: 32.5 g/dL (ref 31.8–35.4)
MCV: 92.9 fL (ref 80–97)
MID (CBC): 0.3 (ref 0–0.9)
MPV: 8.6 fL (ref 0–99.8)
PLATELET COUNT, POC: 232 10*3/uL (ref 142–424)
POC Granulocyte: 3.5 (ref 2–6.9)
POC LYMPH PERCENT: 31.9 %L (ref 10–50)
POC MID %: 4.7 %M (ref 0–12)
RBC: 4.99 M/uL (ref 4.04–5.48)
RDW, POC: 13.9 %
WBC: 5.5 10*3/uL (ref 4.6–10.2)

## 2017-11-11 LAB — POCT URINALYSIS DIP (MANUAL ENTRY)
Bilirubin, UA: NEGATIVE
Glucose, UA: NEGATIVE mg/dL
Ketones, POC UA: NEGATIVE mg/dL
NITRITE UA: NEGATIVE
PH UA: 5.5 (ref 5.0–8.0)
PROTEIN UA: NEGATIVE mg/dL
RBC UA: NEGATIVE
SPEC GRAV UA: 1.02 (ref 1.010–1.025)
UROBILINOGEN UA: 0.2 U/dL

## 2017-11-11 LAB — POCT GLYCOSYLATED HEMOGLOBIN (HGB A1C): Hemoglobin A1C: 6.6 % — AB (ref 4.0–5.6)

## 2017-11-11 MED ORDER — BLOOD GLUCOSE MONITOR KIT: PACK | 0 refills | Status: DC

## 2017-11-11 MED ORDER — METFORMIN HCL 1000 MG PO TABS: ORAL_TABLET | ORAL | 1 refills | Status: DC

## 2017-11-11 MED ORDER — AMLODIPINE-OLMESARTAN 10-40 MG PO TABS: 1.0000 | ORAL_TABLET | Freq: Every day | ORAL | 1 refills | Status: DC

## 2017-11-11 MED ORDER — GLUCOSE BLOOD VI STRP: ORAL_STRIP | 3 refills | Status: DC
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
## 2017-11-11 NOTE — Patient Instructions (Addendum)
IF you received an x-ray today, you will receive an invoice from [facility] Rad[facility]. Please contact [facility] at [phone] with questions or concerns regarding your invoice.   IF you received labwork today, you will receive an invoice from [facility]. Please contact [facility] at [phone] with questions or concerns regarding your invoice.   Our billing staff will not be able to assist you with questions regarding bills from these companies.  You will be contacted with the lab results as soon as they are available. The fastest way to get your results is to activate your My Chart account. Instructions are located on the last page of this paperwork. If you have not heard from [place] regarding the results in 2 weeks, please contact this office.     Dizziness Dizziness is a common problem. It is a feeling of unsteadiness or light-headedness. You may feel like you are about to faint. Dizziness can lead to injury if you stumble or fall. Anyone can become dizzy, but dizziness is more common in older adults. This condition can be caused by a number of things, including medicines, dehydration, or illness. Follow these instructions at home: Eating and drinking  Drink enough fluid to keep your urine clear or pale yellow. This helps to keep you from becoming dehydrated. Try to drink more clear fluids, such as water.  Do not drink alcohol.  Limit your caffeine intake if told to do so by your health care provider. Check ingredients and nutrition facts to see if a food or beverage contains caffeine.  Limit your salt (sodium) intake if told to do so by your health care provider. Check ingredients and nutrition facts to see if a food or beverage contains sodium. Activity  Avoid making quick movements. ? Rise slowly from chairs and steady yourself until you feel okay. ? In the morning, first sit up on the side of the bed. When you feel okay, stand slowly while you hold onto something until  you know that your balance is fine.  If you need to stand in one place for a long time, move your legs often. Tighten and relax the muscles in your legs while you are standing.  Do not drive or use heavy machinery if you feel dizzy.  Avoid bending down if you feel dizzy. Place items in your home so that they are easy for you to reach without leaning over. Lifestyle  Do not use any products that contain nicotine or tobacco, such as cigarettes and e-cigarettes. If you need help quitting, ask your health care provider.  Try to reduce your stress level by using methods such as yoga or meditation. Talk with your health care provider if you need help to manage your stress. General instructions  Watch your dizziness for any changes.  Take over-the-counter and prescription medicines only as told by your health care provider. Talk with your health care provider if you think that your dizziness is caused by a medicine that you are taking.  Tell a friend or a family member that you are feeling dizzy. If he or she notices any changes in your behavior, have this person call your health care provider.  Keep all follow-up visits as told by your health care provider. This is important. Contact a health care provider if:  Your dizziness does not go away.  Your dizziness or light-headedness gets worse.  You feel nauseous.  You have reduced hearing.  You have new symptoms.  You are unsteady on your feet  or you feel like the room is spinning. Get help right away if:  You vomit or have diarrhea and are unable to eat or drink anything.  You have problems talking, walking, swallowing, or using your arms, hands, or legs.  You feel generally weak.  You are not thinking clearly or you have trouble forming sentences. It may take a friend or family member to notice this.  You have chest pain, abdominal pain, shortness of breath, or sweating.  Your vision changes.  You have any bleeding.  You  have a severe headache.  You have neck pain or a stiff neck.  You have a fever. These symptoms may represent a serious problem that is an emergency. Do not wait to see if the symptoms will go away. Get medical help right away. Call your local emergency services (911 in the [place] Do not drive yourself to the hospital. Summary  Dizziness is a feeling of unsteadiness or light-headedness. This condition can be caused by a number of things, including medicines, dehydration, or illness.  Anyone can become dizzy, but dizziness is more common in older adults.  Drink enough fluid to keep your urine clear or pale yellow. Do not drink alcohol.  Avoid making quick movements if you feel dizzy. Monitor your dizziness for any changes. This information is not intended to replace advice given to you by your health care provider. Make sure you discuss any questions you have with your health care provider. Document Released: 10/28/2000 Document Revised: 06/06/2016 Document Reviewed: 06/06/2016 Elsevier Interactive Patient Education  2018 [employer].  Exercising to [employer] Exercising can help you to lose weight. In order to lose weight through exercise, you need to do vigorous-intensity exercise. You can tell that you are exercising with vigorous intensity if you are breathing very hard and fast and cannot hold a conversation while exercising. Moderate-intensity exercise helps to maintain your current weight. You can tell that you are exercising at a moderate level if you have a higher heart rate and faster breathing, but you are still able to hold a conversation. How often should I exercise? Choose an activity that you enjoy and set realistic goals. Your health care provider can help you to make an activity plan that works for you. Exercise regularly as directed by your health care provider. This may include:  Doing resistance training twice each week, such as: ? Push-ups. ? Sit-ups. ? Lifting  weights. ? Using resistance bands.  Doing a given intensity of exercise for a given amount of time. Choose from these options: ? 150 minutes of moderate-intensity exercise every week. ? 75 minutes of vigorous-intensity exercise every week. ? A mix of moderate-intensity and vigorous-intensity exercise every week.  Children, pregnant women, people who are out of shape, people who are overweight, and older adults may need to consult a health care provider for individual recommendations. If you have any sort of medical condition, be sure to consult your health care provider before starting a new exercise program. What are some activities that can help me to lose weight?  Walking at a rate of at least 4.5 miles an hour.  Jogging or running at a rate of 5 miles per hour.  Biking at a rate of at least 10 miles per hour.  Lap swimming.  Roller-skating or in-line skating.  Cross-country skiing.  Vigorous competitive sports, such as football, basketball, and soccer.  Jumping rope.  Aerobic dancing. How can I be more active in my day-to-day activities?  Use  the stairs instead of the elevator.  Take a walk during your lunch break.  If you drive, park your car farther away from work or school.  If you take public transportation, get off one stop early and walk the rest of the way.  Make all of your phone calls while standing up and walking around.  Get up, stretch, and walk around every 30 minutes throughout the day. What guidelines should I follow while exercising?  Do not exercise so much that you hurt yourself, feel dizzy, or get very short of breath.  Consult your health care provider prior to starting a new exercise program.  Wear comfortable clothes and shoes with good support.  Drink plenty of water while you exercise to prevent dehydration or heat stroke. Body water is lost during exercise and must be replaced.  Work out until you breathe faster and your heart beats  faster. This information is not intended to replace advice given to you by your health care provider. Make sure you discuss any questions you have with your health care provider. Document Released: 06/06/2010 Document Revised: 10/10/2015 Document Reviewed: 10/05/2013 Elsevier Interactive Patient Education  [employer].

## 2017-11-12 LAB — URINE CULTURE

## 2017-11-12 MED ORDER — METFORMIN HCL ER 500 MG PO TB24: 2000.0000 mg | ORAL_TABLET | Freq: Every day | ORAL | 3 refills | Status: DC

## 2017-11-14 LAB — LIPID PANEL
CHOLESTEROL TOTAL: 200 mg/dL — AB (ref 100–199)
Chol/HDL Ratio: 3.5 ratio (ref 0.0–4.4)
HDL: 57 mg/dL (ref >39)
LDL CALC: 122 mg/dL — AB (ref 0–99)
Triglycerides: 107 mg/dL (ref 0–149)
VLDL Cholesterol Cal: 21 mg/dL (ref 5–40)

## 2017-11-14 LAB — PTH, INTACT AND CALCIUM: PTH: 34 pg/mL (ref 15–65)

## 2017-11-14 LAB — COMPREHENSIVE METABOLIC PANEL
A/G RATIO: 1.5 (ref 1.2–2.2)
ALBUMIN: 4.7 g/dL (ref 3.6–4.8)
ALK PHOS: 76 IU/L (ref 39–117)
ALT: 25 IU/L (ref 0–32)
AST: 27 IU/L (ref 0–40)
BILIRUBIN TOTAL: 0.4 mg/dL (ref 0.0–1.2)
BUN / CREAT RATIO: 17 (ref 12–28)
BUN: 18 mg/dL (ref 8–27)
CO2: 20 mmol/L (ref 20–29)
CREATININE: 1.07 mg/dL — AB (ref 0.57–1.00)
Calcium: 10.5 mg/dL — ABNORMAL HIGH (ref 8.7–10.3)
Chloride: 102 mmol/L (ref 96–106)
GFR calc Af Amer: 63 mL/min/{1.73_m2} (ref >59)
GFR calc non Af Amer: 55 mL/min/{1.73_m2} — ABNORMAL LOW (ref >59)
GLOBULIN, TOTAL: 3.1 g/dL (ref 1.5–4.5)
Glucose: 109 mg/dL — ABNORMAL HIGH (ref 65–99)
POTASSIUM: 4.4 mmol/L (ref 3.5–5.2)
SODIUM: 141 mmol/L (ref 134–144)
Total Protein: 7.8 g/dL (ref 6.0–8.5)

## 2017-11-14 LAB — IRON,TIBC AND FERRITIN PANEL
Ferritin: 349 ng/mL — ABNORMAL HIGH (ref 15–150)
IRON SATURATION: 41 % (ref 15–55)
IRON: 144 ug/dL — AB (ref 27–139)
TIBC: 352 ug/dL (ref 250–450)
UIBC: 208 ug/dL (ref 118–369)

## 2017-11-14 LAB — TSH: TSH: 2.3 u[IU]/mL (ref 0.450–4.500)

## 2017-11-14 LAB — VITAMIN B12: Vitamin B-12: 567 pg/mL (ref 232–1245)

## 2017-11-14 LAB — VITAMIN D 25 HYDROXY (VIT D DEFICIENCY, FRACTURES): VIT D 25 HYDROXY: 28.1 ng/mL — AB (ref 30.0–100.0)

## 2018-01-10 ENCOUNTER — Other Ambulatory Visit: Payer: Self-pay | Admitting: Obstetrics and Gynecology

## 2018-01-10 DIAGNOSIS — R921 Mammographic calcification found on diagnostic imaging of breast: Secondary | ICD-10-CM

## 2018-01-19 ENCOUNTER — Other Ambulatory Visit: Payer: Self-pay | Admitting: Obstetrics and Gynecology

## 2018-01-19 ENCOUNTER — Ambulatory Visit
Admission: RE | Admit: 2018-01-19 | Discharge: 2018-01-19 | Disposition: A | Discharge status: Home / Self Care | Payer: Managed Care, Other (non HMO) | Source: Ambulatory Visit | Attending: Obstetrics and Gynecology | Admitting: Obstetrics and Gynecology

## 2018-01-19 DIAGNOSIS — R921 Mammographic calcification found on diagnostic imaging of breast: Secondary | ICD-10-CM

## 2018-01-24 ENCOUNTER — Ambulatory Visit
Admission: RE | Admit: 2018-01-24 | Discharge: 2018-01-24 | Disposition: A | Discharge status: Home / Self Care | Payer: Managed Care, Other (non HMO) | Source: Ambulatory Visit | Attending: Obstetrics and Gynecology | Admitting: Obstetrics and Gynecology

## 2018-01-24 ENCOUNTER — Other Ambulatory Visit: Payer: Self-pay | Admitting: Obstetrics and Gynecology

## 2018-01-24 DIAGNOSIS — R921 Mammographic calcification found on diagnostic imaging of breast: Secondary | ICD-10-CM

## 2018-02-15 ENCOUNTER — Other Ambulatory Visit: Payer: Self-pay | Admitting: General Surgery

## 2018-02-15 DIAGNOSIS — R921 Mammographic calcification found on diagnostic imaging of breast: Secondary | ICD-10-CM

## 2018-02-15 HISTORY — PX: BREAST EXCISIONAL BIOPSY: SUR124

## 2018-02-23 ENCOUNTER — Ambulatory Visit
Admission: RE | Admit: 2018-02-23 | Discharge: 2018-02-23 | Disposition: A | Discharge status: Home / Self Care | Payer: Managed Care, Other (non HMO) | Source: Ambulatory Visit | Attending: General Surgery | Admitting: General Surgery

## 2018-02-23 DIAGNOSIS — R921 Mammographic calcification found on diagnostic imaging of breast: Secondary | ICD-10-CM

## 2018-03-01 ENCOUNTER — Other Ambulatory Visit: Payer: Self-pay | Admitting: General Surgery

## 2018-03-01 DIAGNOSIS — N6099 Unspecified benign mammary dysplasia of unspecified breast: Secondary | ICD-10-CM

## 2018-03-02 ENCOUNTER — Other Ambulatory Visit: Payer: Self-pay

## 2018-03-02 ENCOUNTER — Encounter (HOSPITAL_BASED_OUTPATIENT_CLINIC_OR_DEPARTMENT_OTHER): Payer: Self-pay | Admitting: *Deleted

## 2018-03-03 ENCOUNTER — Encounter (HOSPITAL_BASED_OUTPATIENT_CLINIC_OR_DEPARTMENT_OTHER)
Admission: RE | Admit: 2018-03-03 | Discharge: 2018-03-03 | Disposition: A | Discharge status: Home / Self Care | Payer: Managed Care, Other (non HMO) | Source: Ambulatory Visit | Attending: General Surgery | Admitting: General Surgery

## 2018-03-03 DIAGNOSIS — Z01812 Encounter for preprocedural laboratory examination: Secondary | ICD-10-CM | POA: Diagnosis present

## 2018-03-03 LAB — BASIC METABOLIC PANEL
Anion gap: 11 (ref 5–15)
BUN: 15 mg/dL (ref 8–23)
CO2: 29 mmol/L (ref 22–32)
Calcium: 10.5 mg/dL — ABNORMAL HIGH (ref 8.9–10.3)
Chloride: 100 mmol/L (ref 98–111)
Creatinine, Ser: 1.11 mg/dL — ABNORMAL HIGH (ref 0.44–1.00)
GFR calc Af Amer: 59 mL/min — ABNORMAL LOW (ref >60)
GFR calc non Af Amer: 51 mL/min — ABNORMAL LOW (ref >60)
Glucose, Bld: 126 mg/dL — ABNORMAL HIGH (ref 70–99)
Potassium: 5.5 mmol/L — ABNORMAL HIGH (ref 3.5–5.1)
Sodium: 140 mmol/L (ref 135–145)

## 2018-03-03 MED ORDER — ENSURE PRE-SURGERY PO LIQD: 296.0000 mL | Freq: Once | ORAL | Status: DC

## 2018-03-03 NOTE — Progress Notes (Addendum)
Ensure pre surgery drink given with instructions to complete by [place], pt verbalized understanding.  Lab results reviewed by Dr. Gifford Shave, will proceed with surgery as scheduled.

## 2018-03-08 ENCOUNTER — Ambulatory Visit
Admission: RE | Admit: 2018-03-08 | Discharge: 2018-03-08 | Disposition: A | Discharge status: Home / Self Care | Payer: Managed Care, Other (non HMO) | Source: Ambulatory Visit | Attending: General Surgery | Admitting: General Surgery

## 2018-03-08 DIAGNOSIS — N6099 Unspecified benign mammary dysplasia of unspecified breast: Secondary | ICD-10-CM

## 2018-03-09 ENCOUNTER — Ambulatory Visit (HOSPITAL_BASED_OUTPATIENT_CLINIC_OR_DEPARTMENT_OTHER)
Admission: RE | Admit: 2018-03-09 | Discharge: 2018-03-09 | Disposition: A | Discharge status: Home / Self Care | Payer: Managed Care, Other (non HMO) | Source: Ambulatory Visit | Attending: General Surgery | Admitting: General Surgery

## 2018-03-09 ENCOUNTER — Ambulatory Visit
Admission: RE | Admit: 2018-03-09 | Discharge: 2018-03-09 | Disposition: A | Discharge status: Home / Self Care | Payer: Managed Care, Other (non HMO) | Source: Ambulatory Visit | Attending: General Surgery | Admitting: General Surgery

## 2018-03-09 ENCOUNTER — Ambulatory Visit (HOSPITAL_BASED_OUTPATIENT_CLINIC_OR_DEPARTMENT_OTHER): Payer: Managed Care, Other (non HMO) | Admitting: Certified Registered"

## 2018-03-09 ENCOUNTER — Encounter (HOSPITAL_BASED_OUTPATIENT_CLINIC_OR_DEPARTMENT_OTHER): Payer: Self-pay | Admitting: Certified Registered"

## 2018-03-09 ENCOUNTER — Other Ambulatory Visit: Payer: Self-pay

## 2018-03-09 ENCOUNTER — Encounter (HOSPITAL_BASED_OUTPATIENT_CLINIC_OR_DEPARTMENT_OTHER)
Admission: RE | Disposition: A | Discharge status: Home / Self Care | Payer: Self-pay | Source: Ambulatory Visit | Attending: General Surgery

## 2018-03-09 DIAGNOSIS — R921 Mammographic calcification found on diagnostic imaging of breast: Secondary | ICD-10-CM | POA: Diagnosis present

## 2018-03-09 DIAGNOSIS — N62 Hypertrophy of breast: Secondary | ICD-10-CM | POA: Diagnosis not present

## 2018-03-09 DIAGNOSIS — N6099 Unspecified benign mammary dysplasia of unspecified breast: Secondary | ICD-10-CM

## 2018-03-09 DIAGNOSIS — D241 Benign neoplasm of right breast: Secondary | ICD-10-CM | POA: Insufficient documentation

## 2018-03-09 DIAGNOSIS — Z87891 Personal history of nicotine dependence: Secondary | ICD-10-CM | POA: Insufficient documentation

## 2018-03-09 DIAGNOSIS — E669 Obesity, unspecified: Secondary | ICD-10-CM | POA: Insufficient documentation

## 2018-03-09 DIAGNOSIS — E7800 Pure hypercholesterolemia, unspecified: Secondary | ICD-10-CM | POA: Diagnosis not present

## 2018-03-09 DIAGNOSIS — N6312 Unspecified lump in the right breast, upper inner quadrant: Secondary | ICD-10-CM | POA: Insufficient documentation

## 2018-03-09 DIAGNOSIS — K219 Gastro-esophageal reflux disease without esophagitis: Secondary | ICD-10-CM | POA: Diagnosis not present

## 2018-03-09 DIAGNOSIS — G43909 Migraine, unspecified, not intractable, without status migrainosus: Secondary | ICD-10-CM | POA: Diagnosis not present

## 2018-03-09 DIAGNOSIS — E119 Type 2 diabetes mellitus without complications: Secondary | ICD-10-CM | POA: Insufficient documentation

## 2018-03-09 DIAGNOSIS — I10 Essential (primary) hypertension: Secondary | ICD-10-CM | POA: Insufficient documentation

## 2018-03-09 DIAGNOSIS — Z6832 Body mass index (BMI) 32.0-32.9, adult: Secondary | ICD-10-CM | POA: Insufficient documentation

## 2018-03-09 DIAGNOSIS — Z7984 Long term (current) use of oral hypoglycemic drugs: Secondary | ICD-10-CM | POA: Diagnosis not present

## 2018-03-09 HISTORY — DX: Mammographic calcification found on diagnostic imaging of breast: R92.1

## 2018-03-09 HISTORY — DX: Gastro-esophageal reflux disease without esophagitis: K21.9

## 2018-03-09 HISTORY — PX: RADIOACTIVE SEED GUIDED EXCISIONAL BREAST BIOPSY: SHX6490

## 2018-03-09 LAB — GLUCOSE, CAPILLARY
GLUCOSE-CAPILLARY: 133 mg/dL — AB (ref 70–99)
Glucose-Capillary: 105 mg/dL — ABNORMAL HIGH (ref 70–99)

## 2018-03-09 SURGERY — RADIOACTIVE SEED GUIDED BREAST BIOPSY
Anesthesia: General | Site: Breast | Laterality: Right

## 2018-03-09 MED ORDER — PROMETHAZINE HCL 25 MG/ML IJ SOLN: 6.2500 mg | INTRAMUSCULAR | Status: DC | PRN

## 2018-03-09 MED ORDER — CEFAZOLIN SODIUM-DEXTROSE 2-4 GM/100ML-% IV SOLN
INTRAVENOUS | Status: AC
  Filled 2018-03-09: qty 100

## 2018-03-09 MED ORDER — LACTATED RINGERS IV SOLN
INTRAVENOUS | Status: DC
  Administered 2018-03-09: 13:00:00 via INTRAVENOUS

## 2018-03-09 MED ORDER — CEFAZOLIN SODIUM-DEXTROSE 2-4 GM/100ML-% IV SOLN
2.0000 g | INTRAVENOUS | Status: AC
  Administered 2018-03-09: 2 g via INTRAVENOUS

## 2018-03-09 MED ORDER — SCOPOLAMINE 1 MG/3DAYS TD PT72: 1.0000 | MEDICATED_PATCH | Freq: Once | TRANSDERMAL | Status: DC | PRN

## 2018-03-09 MED ORDER — OXYCODONE HCL 5 MG/5ML PO SOLN: 5.0000 mg | Freq: Once | ORAL | Status: DC | PRN

## 2018-03-09 MED ORDER — MIDAZOLAM HCL 2 MG/2ML IJ SOLN
INTRAMUSCULAR | Status: AC
  Filled 2018-03-09: qty 2

## 2018-03-09 MED ORDER — BUPIVACAINE HCL (PF) 0.25 % IJ SOLN
INTRAMUSCULAR | Status: AC
  Filled 2018-03-09: qty 30

## 2018-03-09 MED ORDER — HYDROMORPHONE HCL 1 MG/ML IJ SOLN
0.2500 mg | INTRAMUSCULAR | Status: DC | PRN
  Administered 2018-03-09: 0.25 mg via INTRAVENOUS

## 2018-03-09 MED ORDER — ACETAMINOPHEN 500 MG PO TABS
1000.0000 mg | ORAL_TABLET | ORAL | Status: AC
  Administered 2018-03-09: 1000 mg via ORAL

## 2018-03-09 MED ORDER — FENTANYL CITRATE (PF) 100 MCG/2ML IJ SOLN
INTRAMUSCULAR | Status: AC
  Filled 2018-03-09: qty 2

## 2018-03-09 MED ORDER — PROPOFOL 10 MG/ML IV BOLUS
INTRAVENOUS | Status: DC | PRN
  Administered 2018-03-09: 140 mg via INTRAVENOUS

## 2018-03-09 MED ORDER — TRAMADOL HCL 50 MG PO TABS: 50.0000 mg | ORAL_TABLET | Freq: Four times a day (QID) | ORAL | 0 refills | Status: DC | PRN

## 2018-03-09 MED ORDER — GABAPENTIN 100 MG PO CAPS
ORAL_CAPSULE | ORAL | Status: AC
  Filled 2018-03-09: qty 1

## 2018-03-09 MED ORDER — ACETAMINOPHEN 500 MG PO TABS
ORAL_TABLET | ORAL | Status: AC
  Filled 2018-03-09: qty 2

## 2018-03-09 MED ORDER — BUPIVACAINE HCL (PF) 0.25 % IJ SOLN
INTRAMUSCULAR | Status: DC | PRN
  Administered 2018-03-09: 10 mL

## 2018-03-09 MED ORDER — PHENYLEPHRINE HCL 10 MG/ML IJ SOLN
INTRAMUSCULAR | Status: DC | PRN
  Administered 2018-03-09: 80 ug via INTRAVENOUS
  Administered 2018-03-09: 120 ug via INTRAVENOUS

## 2018-03-09 MED ORDER — GABAPENTIN 100 MG PO CAPS
100.0000 mg | ORAL_CAPSULE | ORAL | Status: AC
  Administered 2018-03-09: 100 mg via ORAL

## 2018-03-09 MED ORDER — DEXAMETHASONE SODIUM PHOSPHATE 4 MG/ML IJ SOLN
INTRAMUSCULAR | Status: DC | PRN
  Administered 2018-03-09: 10 mg via INTRAVENOUS

## 2018-03-09 MED ORDER — HYDROMORPHONE HCL 1 MG/ML IJ SOLN
INTRAMUSCULAR | Status: AC
  Filled 2018-03-09: qty 0.5

## 2018-03-09 MED ORDER — ONDANSETRON HCL 4 MG/2ML IJ SOLN
INTRAMUSCULAR | Status: DC | PRN
  Administered 2018-03-09: 4 mg via INTRAVENOUS

## 2018-03-09 MED ORDER — FENTANYL CITRATE (PF) 100 MCG/2ML IJ SOLN
50.0000 ug | INTRAMUSCULAR | Status: AC | PRN
  Administered 2018-03-09: 25 ug via INTRAVENOUS
  Administered 2018-03-09: 50 ug via INTRAVENOUS
  Administered 2018-03-09: 25 ug via INTRAVENOUS

## 2018-03-09 MED ORDER — OXYCODONE HCL 5 MG PO TABS: 5.0000 mg | ORAL_TABLET | Freq: Once | ORAL | Status: DC | PRN

## 2018-03-09 MED ORDER — EPHEDRINE SULFATE 50 MG/ML IJ SOLN
INTRAMUSCULAR | Status: DC | PRN
  Administered 2018-03-09: 10 mg via INTRAVENOUS

## 2018-03-09 MED ORDER — MIDAZOLAM HCL 2 MG/2ML IJ SOLN
1.0000 mg | INTRAMUSCULAR | Status: DC | PRN
  Administered 2018-03-09: 2 mg via INTRAVENOUS

## 2018-03-09 SURGICAL SUPPLY — 64 items
ADH SKN CLS APL DERMABOND .7 (GAUZE/BANDAGES/DRESSINGS) ×1
APPLIER CLIP 9.375 MED OPEN (MISCELLANEOUS)
APR CLP MED 9.3 20 MLT OPN (MISCELLANEOUS)
BINDER BREAST LRG (GAUZE/BANDAGES/DRESSINGS) ×3 IMPLANT
BINDER BREAST MEDIUM (GAUZE/BANDAGES/DRESSINGS) IMPLANT
BINDER BREAST XLRG (GAUZE/BANDAGES/DRESSINGS) IMPLANT
BINDER BREAST XXLRG (GAUZE/BANDAGES/DRESSINGS) IMPLANT
BLADE SURG 15 STRL LF DISP TIS (BLADE) ×1 IMPLANT
BLADE SURG 15 STRL SS (BLADE) ×2
CANISTER SUC SOCK COL 7IN (MISCELLANEOUS) IMPLANT
CANISTER SUCT 1200ML W/VALVE (MISCELLANEOUS) IMPLANT
CHLORAPREP W/TINT 26ML (MISCELLANEOUS) ×3 IMPLANT
CLIP APPLIE 9.375 MED OPEN (MISCELLANEOUS) IMPLANT
CLIP VESOCCLUDE SM WIDE 6/CT (CLIP) ×3 IMPLANT
CLOSURE WOUND 1/2 X4 (GAUZE/BANDAGES/DRESSINGS) ×1
COVER BACK TABLE 60X90IN (DRAPES) ×3 IMPLANT
COVER MAYO STAND STRL (DRAPES) ×3 IMPLANT
COVER PROBE W GEL 5X96 (DRAPES) ×3 IMPLANT
COVER WAND RF STERILE (DRAPES) IMPLANT
DECANTER SPIKE VIAL GLASS SM (MISCELLANEOUS) IMPLANT
DERMABOND ADVANCED (GAUZE/BANDAGES/DRESSINGS) ×2
DERMABOND ADVANCED .7 DNX12 (GAUZE/BANDAGES/DRESSINGS) ×1 IMPLANT
DEVICE DUBIN W/COMP PLATE 8390 (MISCELLANEOUS) ×9 IMPLANT
DRAPE LAPAROSCOPIC ABDOMINAL (DRAPES) ×3 IMPLANT
DRAPE UTILITY XL STRL (DRAPES) ×3 IMPLANT
DRSG TEGADERM 4X4.75 (GAUZE/BANDAGES/DRESSINGS) IMPLANT
ELECT COATED BLADE 2.86 ST (ELECTRODE) ×3 IMPLANT
ELECT REM PT RETURN 9FT ADLT (ELECTROSURGICAL) ×3
ELECTRODE REM PT RTRN 9FT ADLT (ELECTROSURGICAL) ×1 IMPLANT
GAUZE SPONGE 4X4 12PLY STRL LF (GAUZE/BANDAGES/DRESSINGS) IMPLANT
GLOVE BIO SURGEON STRL SZ 6.5 (GLOVE) ×2 IMPLANT
GLOVE BIO SURGEON STRL SZ7 (GLOVE) ×9 IMPLANT
GLOVE BIO SURGEONS STRL SZ 6.5 (GLOVE) ×1
GLOVE BIOGEL PI IND STRL 7.5 (GLOVE) ×1 IMPLANT
GLOVE BIOGEL PI INDICATOR 7.5 (GLOVE) ×2
GLOVE EXAM NITRILE MD LF STRL (GLOVE) ×3 IMPLANT
GLOVE SURG SYN 8.0 (GLOVE) ×6 IMPLANT
GOWN STRL REIN XL XLG (GOWN DISPOSABLE) ×3 IMPLANT
GOWN STRL REUS W/ TWL LRG LVL3 (GOWN DISPOSABLE) ×2 IMPLANT
GOWN STRL REUS W/TWL LRG LVL3 (GOWN DISPOSABLE) ×6
HEMOSTAT ARISTA ABSORB 3G PWDR (MISCELLANEOUS) ×3 IMPLANT
ILLUMINATOR WAVEGUIDE N/F (MISCELLANEOUS) ×3 IMPLANT
KIT MARKER MARGIN INK (KITS) ×6 IMPLANT
LIGHT WAVEGUIDE WIDE FLAT (MISCELLANEOUS) IMPLANT
NEEDLE HYPO 25X1 1.5 SAFETY (NEEDLE) ×3 IMPLANT
NS IRRIG 1000ML POUR BTL (IV SOLUTION) IMPLANT
PACK BASIN DAY SURGERY FS (CUSTOM PROCEDURE TRAY) ×3 IMPLANT
PENCIL BUTTON HOLSTER BLD 10FT (ELECTRODE) ×3 IMPLANT
SLEEVE SCD COMPRESS KNEE MED (MISCELLANEOUS) ×3 IMPLANT
SPONGE LAP 4X18 RFD (DISPOSABLE) ×3 IMPLANT
STRIP CLOSURE SKIN 1/2X4 (GAUZE/BANDAGES/DRESSINGS) ×2 IMPLANT
SUT MNCRL AB 4-0 PS2 18 (SUTURE) IMPLANT
SUT MON AB 5-0 PS2 18 (SUTURE) ×6 IMPLANT
SUT SILK 2 0 SH (SUTURE) IMPLANT
SUT VIC AB 2-0 SH 27 (SUTURE) ×6
SUT VIC AB 2-0 SH 27XBRD (SUTURE) ×2 IMPLANT
SUT VIC AB 3-0 SH 27 (SUTURE) ×2
SUT VIC AB 3-0 SH 27X BRD (SUTURE) ×1 IMPLANT
SYR CONTROL 10ML LL (SYRINGE) ×3 IMPLANT
TOWEL GREEN STERILE FF (TOWEL DISPOSABLE) ×3 IMPLANT
TOWEL OR NON WOVEN STRL DISP B (DISPOSABLE) IMPLANT
TUBE CONNECTING 20'X1/4 (TUBING)
TUBE CONNECTING 20X1/4 (TUBING) IMPLANT
YANKAUER SUCT BULB TIP NO VENT (SUCTIONS) IMPLANT

## 2018-03-09 NOTE — Op Note (Signed)
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 Serita Grammes Anesthesia: General Estimated blood loss: Minimal Complications: None Drains: None Specimens: 1.  Medial right breast lesion with seed marked with paint 2.  Upper inner quadrant right breast lesion with seed separate marked with paint 3.  Lateral right breast lesion marked with paint Special count was correct at completion Disposition to recovery stable  Indications: This is a 62 year old female who presents with a larger area of calcifications on a screening mammogram.  She has had 3 separate areas biopsied.  These are either atypia or papillomas.  In discussion with where we elected to remove these 3 groups of calcifications and then await those results.  She had 3 radioactive seeds placed and I had these mammograms available for me in the operating room.  Procedure:After informed consent was obtained the patient was taken to the operating room.  She was given Ancef.  SCDs were in place.  She was placed under general anesthesia with an LMA.  She was prepped and draped in the standard sterile surgical fashion.  A surgical timeout was then performed.  I located the seeds with the neoprobe.  I made a periareolar incision in the medial aspect of the breast after infiltration with local anesthetic to hide the scar later.  I then remove the medial 2 seeds first.  The first seed was removed along with the clip.  Mammogram confirmed removal of the clip in the seed.  I then went towards the upper inner quadrant and remove the neck seed and the clip.  This seed was sitting in some fatty tissue and I placed this in a container separate.  I did get confirmation removal of the second clip.  I then elected to go underneath the nipple areolar  complex to retrieve the other seed.  I tunneled to this area using the lighted retractor.  I then remove the seed and the surrounding tissue.  Mammogram confirmed removal of the seed and the clip.  All 3 seeds and all 3 clips were accounted for.  I then obtained hemostasis.  I closed the breast tissue with 2-0 Vicryl suture.  I closed the dermis with 3-0 Vicryl and the skin with 5-0 Monocryl.  I applied glue and Steri-Strips.  She tolerated this well was extubated transferred to recovery in stable condition.

## 2018-03-09 NOTE — Anesthesia Preprocedure Evaluation (Signed)
Anesthesia Evaluation  Patient identified by MRN, date of birth, ID band Patient awake    Reviewed: Allergy & Precautions, NPO status , Patient's Chart, lab work & pertinent test results  Airway Mallampati: II  TM Distance: >3 FB Neck ROM: Full    Dental no notable dental hx.    Pulmonary neg pulmonary ROS, former smoker,    Pulmonary exam normal breath sounds clear to auscultation       Cardiovascular hypertension, Pt. on medications negative cardio ROS Normal cardiovascular exam Rhythm:Regular Rate:Normal     Neuro/Psych  Headaches, negative psych ROS   GI/Hepatic Neg liver ROS, GERD  ,  Endo/Other  negative endocrine ROSdiabetes, Type 2, Oral Hypoglycemic Agents  Renal/GU negative Renal ROS  negative genitourinary   Musculoskeletal negative musculoskeletal ROS (+)   Abdominal (+) + obese,   Peds negative pediatric ROS (+)  Hematology negative hematology ROS (+)   Anesthesia Other Findings   Reproductive/Obstetrics negative OB ROS                             Anesthesia Physical Anesthesia Plan  ASA: III  Anesthesia Plan: General   Post-op Pain Management:    Induction: Intravenous  PONV Risk Score and Plan: 3 and Ondansetron, Dexamethasone and Midazolam  Airway Management Planned: LMA  Additional Equipment:   Intra-op Plan:   Post-operative Plan: Extubation in OR  Informed Consent: I have reviewed the patients History and Physical, chart, labs and discussed the procedure including the risks, benefits and alternatives for the proposed anesthesia with the patient or authorized representative who has indicated his/her understanding and acceptance.   Dental advisory given  Plan Discussed with: CRNA  Anesthesia Plan Comments:         Anesthesia Quick Evaluation

## 2018-03-09 NOTE — Transfer of Care (Signed)
Immediate Anesthesia Transfer of Care Note  Patient: Jane Pena  Procedure(s) Performed: RIGHT RADIOACTIVE SEED GUIDED EXCISIONAL BREAST BIOPSY X'S 3 (Right Breast)  Patient Location: PACU  Anesthesia Type:General  Level of Consciousness: awake, alert  and oriented  Airway & Oxygen Therapy: Patient Spontanous Breathing and Patient connected to face mask oxygen  Post-op Assessment: Report given to RN and Post -op Vital signs reviewed and stable  Post vital signs: Reviewed and stable  Last Vitals:  Vitals Value Taken Time  BP    Temp    Pulse    Resp    SpO2      Last Pain:  Vitals:   03/09/18 1137  TempSrc: Oral  PainSc: 0-No pain         Complications: No apparent anesthesia complications

## 2018-03-09 NOTE — Interval H&P Note (Signed)
History and Physical Interval Note:  03/09/2018 12:33 PM  Jane Pena  has presented today for surgery, with the diagnosis of RIGHT BREAST CALCIFICATIONS  The various methods of treatment have been discussed with the patient and family. After consideration of risks, benefits and other options for treatment, the patient has consented to  Procedure(s): RADIOACTIVE SEED GUIDED EXCISIONAL BREAST BIOPSY X'S 3 (Right) as a surgical intervention .  The patient's history has been reviewed, patient examined, no change in status, stable for surgery.  I have reviewed the patient's chart and labs.  Questions were answered to the patient's satisfaction.     Rolm Bookbinder

## 2018-03-09 NOTE — Discharge Instructions (Signed)
[facility] °Office Phone Number [phone] ° °POST OP INSTRUCTIONS °Take 400 mg of ibuprofen every 8 hours or 650 mg tylenol every 6 hours for next 72 hours then as needed. Use ice several times daily also. °Always review your discharge instruction sheet given to you by the facility where your surgery was performed. ° °IF YOU HAVE DISABILITY OR FAMILY LEAVE FORMS, YOU MUST BRING THEM TO THE OFFICE FOR PROCESSING.  DO NOT GIVE THEM TO YOUR DOCTOR. ° °1. A prescription for pain medication may be given to you upon discharge.  Take your pain medication as prescribed, if needed.  If narcotic pain medicine is not needed, then you may take acetaminophen (Tylenol), naprosyn (Alleve) or ibuprofen (Advil) as needed. °2. Take your usually prescribed medications unless otherwise directed °3. If you need a refill on your pain medication, please contact your pharmacy.  They will contact our office to request authorization.  Prescriptions will not be filled after 5pm or on week-ends. °4. You should eat very light the first 24 hours after surgery, such as soup, crackers, pudding, etc.  Resume your normal diet the day after surgery. °5. Most patients will experience some swelling and bruising in the breast.  Ice packs and a good support bra will help.  Wear the breast binder provided or a sports bra for 72 hours day and night.  After that wear a sports bra during the day until you return to the office. Swelling and bruising can take several days to resolve.  °6. It is common to experience some constipation if taking pain medication after surgery.  Increasing fluid intake and taking a stool softener will usually help or prevent this problem from occurring.  A mild laxative (Milk of Magnesia or Miralax) should be taken according to package directions if there are no bowel movements after 48 hours. °7. Unless discharge instructions indicate otherwise, you may remove your bandages 48 hours after surgery and you may  shower at that time.  You may have steri-strips (small skin tapes) in place directly over the incision.  These strips should be left on the skin for 7-10 days and will come off on their own.  If your surgeon used skin glue on the incision, you may shower in 24 hours.  The glue will flake off over the next 2-3 weeks.  Any sutures or staples will be removed at the office during your follow-up visit. °8. ACTIVITIES:  You may resume regular daily activities (gradually increasing) beginning the next day.  Wearing a good support bra or sports bra minimizes pain and swelling.  You may have sexual intercourse when it is comfortable. °a. You may drive when you no longer are taking prescription pain medication, you can comfortably wear a seatbelt, and you can safely maneuver your car and apply brakes. °b. RETURN TO WORK:  ______________________________________________________________________________________ °9. You should see your doctor in the office for a follow-up appointment approximately two weeks after your surgery.  Your doctor’s nurse will typically make your follow-up appointment when she calls you with your pathology report.  Expect your pathology report 3-4 business days after your surgery.  You may call to check if you do not hear from us after three days. °10. OTHER INSTRUCTIONS: _______________________________________________________________________________________________ _____________________________________________________________________________________________________________________________________ °_____________________________________________________________________________________________________________________________________ °_____________________________________________________________________________________________________________________________________ ° °WHEN TO CALL DR WAKEFIELD: °1. Fever over 101.0 °2. Nausea and/or vomiting. °3. Extreme swelling or bruising. °4. Continued bleeding from  incision. °5. Increased pain, redness, or drainage from the incision. ° °The clinic staff is available to   answer your questions during regular business hours.  Please don’t hesitate to call and ask to speak to one of the nurses for clinical concerns.  If you have a medical emergency, go to the nearest emergency room or call 911.  A surgeon from [facility] is always on call at the hospital. ° °For further questions, please visit [email or web address] ° ° ° ° °Post Anesthesia Home Care Instructions ° °Activity: °Get plenty of rest for the remainder of the day. A responsible individual must stay with you for 24 hours following the procedure.  °For the next 24 hours, DO NOT: °-Drive a car °-Operate machinery °-Drink alcoholic beverages °-Take any medication unless instructed by your physician °-Make any legal decisions or sign important papers. ° °Meals: °Start with liquid foods such as gelatin or soup. Progress to regular foods as tolerated. Avoid greasy, spicy, heavy foods. If nausea and/or vomiting occur, drink only clear liquids until the nausea and/or vomiting subsides. Call your physician if vomiting continues. ° °Special Instructions/Symptoms: °Your throat may feel dry or sore from the anesthesia or the breathing tube placed in your throat during surgery. If this causes discomfort, gargle with warm salt water. The discomfort should disappear within 24 hours. ° °If you had a scopolamine patch placed behind your ear for the management of post- operative nausea and/or vomiting: ° °1. The medication in the patch is effective for 72 hours, after which it should be removed.  Wrap patch in a tissue and discard in the trash. Wash hands thoroughly with soap and water. °2. You may remove the patch earlier than 72 hours if you experience unpleasant side effects which may include dry mouth, dizziness or visual disturbances. °3. Avoid touching the patch. Wash your hands with soap and water after contact  with the patch. °   ° °

## 2018-03-09 NOTE — Anesthesia Procedure Notes (Signed)
Procedure Name: LMA Insertion Performed by: Dhanvin Szeto M, CRNA Pre-anesthesia Checklist: Patient identified, Emergency Drugs available, Suction available, Patient being monitored and Timeout performed Patient Re-evaluated:Patient Re-evaluated prior to induction Oxygen Delivery Method: Circle system utilized Preoxygenation: Pre-oxygenation with 100% oxygen Induction Type: IV induction LMA: LMA inserted LMA Size: 4.0 Tube type: Oral Number of attempts: 1 Placement Confirmation: positive ETCO2,  CO2 detector and breath sounds checked- equal and bilateral Tube secured with: Tape Dental Injury: Teeth and Oropharynx as per pre-operative assessment        

## 2018-03-09 NOTE — H&P (Signed)
62 yof referred by Dr Radford Pax for right breast adh on core biopsy. she has pmh of htn, high cholesterol and dm. she has no mass or dc on self exam. she has no personal history of breast disease. no family history of breast or ovarian cancer either. she underwent screening mm that shows b density breasts. the right breast has at least five groups of calcifications. there is one deep in the uoq and 4 within uiq. the medial groups are about 4.5 cm in total and each small group is 5-8 mm. she underwent core biopsy of the lateral area that is adh and one of the medial areas is also adh. she is here with her friend Benedetto Goad who is former nurse at cancer center to discuss options   Past Surgical History Jane Pena; 01/31/2018 3:59 PM) Breast Biopsy  Right. Oral Surgery   Diagnostic Studies History Jane Pena; 01/31/2018 3:59 PM) Colonoscopy  5-10 years ago Mammogram  within last year Pap Smear  1-5 years ago  Allergies Jane Pena; 01/31/2018 3:59 PM) No Known Drug Allergies [01/31/2018]: Allergies Reconciled   Medication History (Jane Pena; 01/31/2018 4:00 PM) metFORMIN HCl ER (500MG  Tablet ER 24HR, Oral) Active. amLODIPine-Olmesartan (10-40MG  Tablet, Oral) Active. Atorvastatin Calcium (10MG  Tablet, Oral) Active. OneTouch Delica Lancets 91Y Active. OneTouch Ultra Blue (In Vitro) Active. Co Q10 (Oral) Specific strength unknown - Active. Ergocalciferol (Oral) Specific strength unknown - Active. Biotin (5MG  Capsule, Oral) Active. Medications Reconciled  Social History Jane Pena; 01/31/2018 3:59 PM) Alcohol use  Occasional alcohol use. No drug use  Tobacco use  Former smoker.  Family History Jane Pena; 01/31/2018 3:59 PM) Diabetes Mellitus  Father, Sister. Hypertension  Daughter, Father, Mother, Sister, Son.  Pregnancy / Birth History Jane Pena; 01/31/2018 3:59 PM) Age at menarche  58 years. Age of  menopause  31-60 Gravida  3 Maternal age  53-20 Para  3  Other Problems Jane Pena; 01/31/2018 3:59 PM) Diabetes Mellitus  High blood pressure  Hypercholesterolemia  Migraine Headache     Review of Systems Jane Pena; 01/31/2018 3:59 PM) General Not Present- Appetite Loss, Chills, Fatigue, Fever, Night Sweats, Weight Gain and Weight Loss. Skin Not Present- Change in Wart/Mole, Dryness, Hives, Jaundice, New Lesions, Non-Healing Wounds, Rash and Ulcer. HEENT Present- Seasonal Allergies and Wears glasses/contact lenses. Not Present- Earache, Hearing Loss, Hoarseness, Nose Bleed, Oral Ulcers, Ringing in the Ears, Sinus Pain, Sore Throat, Visual Disturbances and Yellow Eyes. Respiratory Not Present- Bloody sputum, Chronic Cough, Difficulty Breathing, Snoring and Wheezing. Breast Not Present- Breast Mass, Breast Pain, Nipple Discharge and Skin Changes. Cardiovascular Not Present- Chest Pain, Difficulty Breathing Lying Down, Leg Cramps, Palpitations, Rapid Heart Rate, Shortness of Breath and Swelling of Extremities. Gastrointestinal Present- Indigestion. Not Present- Abdominal Pain, Bloating, Bloody Stool, Change in Bowel Habits, Chronic diarrhea, Constipation, Difficulty Swallowing, Excessive gas, Gets full quickly at meals, Hemorrhoids, Nausea, Rectal Pain and Vomiting. Female Genitourinary Not Present- Frequency, Nocturia, Painful Urination, Pelvic Pain and Urgency. Musculoskeletal Not Present- Back Pain, Joint Pain, Joint Stiffness, Muscle Pain, Muscle Weakness and Swelling of Extremities. Neurological Not Present- Decreased Memory, Fainting, Headaches, Numbness, Seizures, Tingling, Tremor, Trouble walking and Weakness. Psychiatric Not Present- Anxiety, Bipolar, Change in Sleep Pattern, Depression, Fearful and Frequent crying. Endocrine Not Present- Cold Intolerance, Excessive Hunger, Hair Changes, Heat Intolerance, Hot flashes and New Diabetes. Hematology Not Present-  Blood Thinners, Easy Bruising, Excessive bleeding, Gland problems, HIV and Persistent Infections.  Vitals Jane Pena; 01/31/2018 4:01 PM) 01/31/2018 4:00 PM Weight: 182.4 lb  Height: 63in Body Surface Area: 1.86 m Body Mass Index: 32.31 kg/m  Pain Level: 3/10 Temp.: 96.33F(Temporal)  Pulse: 91 (Regular)  P.OX: 98% (Room air) BP: 150/90 (Sitting, Left Arm, Standard)       Physical Exam Rolm Bookbinder MD; 01/31/2018 4:10 PM) General Mental Status-Alert.  Head and Neck Trachea-midline. Thyroid Gland Characteristics - normal size and consistency.  Eye Sclera/Conjunctiva - Bilateral-No scleral icterus.  Chest and Lung Exam Chest and lung exam reveals -quiet, even and easy respiratory effort with no use of accessory muscles and on auscultation, normal breath sounds, no adventitious sounds and normal vocal resonance.  Breast Nipples-No Discharge. Breast Lump-No Palpable Breast Mass.  Cardiovascular Cardiovascular examination reveals -normal heart sounds, regular rate and rhythm with no murmurs.  Abdomen Note: soft no hepatomegaly   Neurologic Neurologic evaluation reveals -alert and oriented x 3 with no impairment of recent or remote memory.  Lymphatic Head & Neck  General Head & Neck Lymphatics: Bilateral - Description - Normal. Axillary  General Axillary Region: Bilateral - Description - Normal. Note: no Bliss adenopathy     Assessment & Plan Rolm Bookbinder MD; 01/31/2018 9:43 PM) ATYPICAL DUCTAL HYPERPLASIA OF RIGHT BREAST (N60.91) Story: Right breast seed guided excisional biopsy times three Will talk to radiology about the 4.5 cm area and how to plan this. this will be hard to excise given the uiq location. discussed indication for seed guided excision with adh being anywhere from 5-20% upgrade rate. we discussed this is high risk lesion and can discuss options for risk reduction moving forward. discussed surgery,  risks and recovery. will proceed soon

## 2018-03-10 ENCOUNTER — Encounter (HOSPITAL_BASED_OUTPATIENT_CLINIC_OR_DEPARTMENT_OTHER): Payer: Self-pay | Admitting: General Surgery

## 2018-03-10 NOTE — Anesthesia Postprocedure Evaluation (Signed)
Anesthesia Post Note  Patient: GLADIE GRAVETTE  Procedure(s) Performed: RIGHT RADIOACTIVE SEED GUIDED EXCISIONAL BREAST BIOPSY X'S 3 (Right Breast)     Patient location during evaluation: PACU Anesthesia Type: General Level of consciousness: awake and alert Pain management: pain level controlled Vital Signs Assessment: post-procedure vital signs reviewed and stable Respiratory status: spontaneous breathing, nonlabored ventilation and respiratory function stable Cardiovascular status: blood pressure returned to baseline and stable Postop Assessment: no apparent nausea or vomiting Anesthetic complications: no    Last Vitals:  Vitals:   03/09/18 1430 03/09/18 1440  BP: (!) 136/94 125/80  Pulse: 84 79  Resp: 16 18  Temp:  36.4 C  SpO2: 100% 96%    Last Pain:  Vitals:   03/09/18 1440  TempSrc:   PainSc: 0-No pain                 Lynda Rainwater

## 2018-05-13 DIAGNOSIS — N6099 Unspecified benign mammary dysplasia of unspecified breast: Secondary | ICD-10-CM | POA: Insufficient documentation

## 2018-05-13 DIAGNOSIS — D219 Benign neoplasm of connective and other soft tissue, unspecified: Secondary | ICD-10-CM | POA: Insufficient documentation

## 2018-05-24 ENCOUNTER — Telehealth: Payer: Self-pay | Admitting: Family Medicine

## 2018-05-24 NOTE — Telephone Encounter (Signed)
LVM for pt to call the office and reschedule their appt that was scheduled with Dr. Brigitte Pulse on 05/26/18. Dr. Brigitte Pulse is out on leave and will not be in the office. When pt calls back, please reschedule at their convenience. Thank you!

## 2018-05-26 ENCOUNTER — Ambulatory Visit: Payer: Managed Care, Other (non HMO) | Admitting: Family Medicine

## 2018-06-09 ENCOUNTER — Telehealth: Payer: Self-pay | Admitting: Family Medicine

## 2018-06-09 NOTE — Telephone Encounter (Signed)
LVM for pt to call the office and reschedule appt that was scheduled with Dr. Shaw on 06/17/18. Due to Dr. Shaw being out on emergency leave, pt can either schedule with a different provider or they may schedule with Dr. Shaw in mid March. When pt calls back, please schedule accordingly. Thank you! °

## 2018-06-17 ENCOUNTER — Ambulatory Visit: Payer: Managed Care, Other (non HMO) | Admitting: Family Medicine

## 2018-07-07 ENCOUNTER — Other Ambulatory Visit: Payer: Self-pay | Admitting: Family Medicine

## 2018-07-07 ENCOUNTER — Telehealth: Payer: Self-pay | Admitting: Family Medicine

## 2018-07-07 MED ORDER — AMLODIPINE-OLMESARTAN 10-40 MG PO TABS: 1.0000 | ORAL_TABLET | Freq: Every day | ORAL | 0 refills | Status: DC

## 2018-07-07 NOTE — Telephone Encounter (Signed)
Courtesy refill  

## 2018-07-07 NOTE — Telephone Encounter (Signed)
LVM for pt to call the office and reschedule the appt that was cancelled for 2/21 due to weather. When pt calls back, please reschedule at their convenience. Thank you

## 2018-07-07 NOTE — Telephone Encounter (Signed)
Copied from [place] [id number]. Topic: Quick Communication - Rx Refill/Question >> Jul 07, 2018  5:11 PM Gustavus Messing wrote: Medication: amLODipine-olmesartan (AZOR) 10-40 MG tablet   Has the patient contacted their pharmacy? No. (Agent: If no, request that the patient contact the pharmacy for the refill.) Patient is completely out of BP medication   Preferred Pharmacy (with phone number or street name): [id number] [address], [place] [phone] (Phone) [phone] (Fax)    Agent: Please be advised that RX refills may take up to 3 business days. We ask that you follow-up with your pharmacy.

## 2018-07-08 ENCOUNTER — Ambulatory Visit: Payer: Managed Care, Other (non HMO) | Admitting: Family Medicine

## 2018-07-26 ENCOUNTER — Other Ambulatory Visit: Payer: Self-pay | Admitting: General Surgery

## 2018-07-26 DIAGNOSIS — R921 Mammographic calcification found on diagnostic imaging of breast: Secondary | ICD-10-CM

## 2018-07-29 ENCOUNTER — Ambulatory Visit (INDEPENDENT_AMBULATORY_CARE_PROVIDER_SITE_OTHER): Payer: Managed Care, Other (non HMO) | Admitting: Family Medicine

## 2018-07-29 ENCOUNTER — Other Ambulatory Visit: Payer: Self-pay

## 2018-07-29 ENCOUNTER — Encounter: Payer: Self-pay | Admitting: Family Medicine

## 2018-07-29 VITALS — BP 138/78 | HR 83 | Temp 98.5°F | Resp 14 | Ht 63.0 in | Wt 178.2 lb

## 2018-07-29 DIAGNOSIS — N182 Chronic kidney disease, stage 2 (mild): Secondary | ICD-10-CM | POA: Diagnosis not present

## 2018-07-29 DIAGNOSIS — E1122 Type 2 diabetes mellitus with diabetic chronic kidney disease: Secondary | ICD-10-CM | POA: Diagnosis not present

## 2018-07-29 DIAGNOSIS — I10 Essential (primary) hypertension: Secondary | ICD-10-CM | POA: Diagnosis not present

## 2018-07-29 DIAGNOSIS — E7800 Pure hypercholesterolemia, unspecified: Secondary | ICD-10-CM | POA: Diagnosis not present

## 2018-07-29 MED ORDER — BLOOD GLUCOSE METER KIT: PACK | 0 refills | Status: DC

## 2018-07-29 MED ORDER — AMLODIPINE-OLMESARTAN 10-40 MG PO TABS: 1.0000 | ORAL_TABLET | Freq: Every day | ORAL | 2 refills | Status: DC
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
## 2018-07-29 NOTE — Patient Instructions (Addendum)
   Depending on A1C we may be able to decrease the dose of metformin or adjust that with another medicine to lessen diarrhea symptoms.  Additionally depending on the LDL you may need a higher dose of atorvastatin.  We can send that in once we review your lab work.  Thanks for coming in today.  Follow-up in 3 months for diabetes.   If you have lab work done today you will be contacted with your lab results within the next 2 weeks.  If you have not heard from [place] then please contact us. The fastest way to get your results is to register for My Chart.   IF you received an x-ray today, you will receive an invoice from [facility]. Please contact [facility] at [phone] with questions or concerns regarding your invoice.   IF you received labwork today, you will receive an invoice from [facility]. Please contact [facility] at [phone] with questions or concerns regarding your invoice.   Our billing staff will not be able to assist you with questions regarding bills from these companies.  You will be contacted with the lab results as soon as they are available. The fastest way to get your results is to activate your My Chart account. Instructions are located on the last page of this paperwork. If you have not heard from [place] regarding the results in 2 weeks, please contact this office.

## 2018-07-30 LAB — COMPREHENSIVE METABOLIC PANEL
ALBUMIN: 4.4 g/dL (ref 3.8–4.8)
ALK PHOS: 92 IU/L (ref 39–117)
ALT: 17 IU/L (ref 0–32)
AST: 17 IU/L (ref 0–40)
Albumin/Globulin Ratio: 1.5 (ref 1.2–2.2)
BILIRUBIN TOTAL: 0.3 mg/dL (ref 0.0–1.2)
BUN/Creatinine Ratio: 14 (ref 12–28)
BUN: 14 mg/dL (ref 8–27)
CO2: 25 mmol/L (ref 20–29)
Calcium: 10.2 mg/dL (ref 8.7–10.3)
Chloride: 105 mmol/L (ref 96–106)
Creatinine, Ser: 0.98 mg/dL (ref 0.57–1.00)
GFR calc Af Amer: 71 mL/min/{1.73_m2} (ref >59)
GFR, EST NON AFRICAN AMERICAN: 61 mL/min/{1.73_m2} (ref >59)
GLOBULIN, TOTAL: 2.9 g/dL (ref 1.5–4.5)
Glucose: 136 mg/dL — ABNORMAL HIGH (ref 65–99)
POTASSIUM: 4.2 mmol/L (ref 3.5–5.2)
Sodium: 144 mmol/L (ref 134–144)
Total Protein: 7.3 g/dL (ref 6.0–8.5)

## 2018-07-30 LAB — LIPID PANEL
CHOL/HDL RATIO: 4.2 ratio (ref 0.0–4.4)
Cholesterol, Total: 211 mg/dL — ABNORMAL HIGH (ref 100–199)
HDL: 50 mg/dL (ref >39)
LDL CALC: 139 mg/dL — AB (ref 0–99)
TRIGLYCERIDES: 110 mg/dL (ref 0–149)
VLDL Cholesterol Cal: 22 mg/dL (ref 5–40)

## 2018-07-30 LAB — HEMOGLOBIN A1C
Est. average glucose Bld gHb Est-mCnc: 134 mg/dL
Hgb A1c MFr Bld: 6.3 % — ABNORMAL HIGH (ref 4.8–5.6)

## 2018-07-31 ENCOUNTER — Encounter: Payer: Self-pay | Admitting: Family Medicine

## 2018-07-31 MED ORDER — ATORVASTATIN CALCIUM 20 MG PO TABS: 20.0000 mg | ORAL_TABLET | Freq: Every day | ORAL | 1 refills | Status: DC

## 2018-07-31 MED ORDER — METFORMIN HCL ER 750 MG PO TB24: 1500.0000 mg | ORAL_TABLET | Freq: Every day | ORAL | 1 refills | Status: DC

## 2018-08-01 ENCOUNTER — Telehealth: Payer: Self-pay | Admitting: Family Medicine

## 2018-08-01 NOTE — Telephone Encounter (Signed)
Pt. Given results and instructions. Verbalizes understanding. 

## 2018-09-09 ENCOUNTER — Other Ambulatory Visit: Payer: Self-pay

## 2018-09-09 ENCOUNTER — Ambulatory Visit
Admission: RE | Admit: 2018-09-09 | Discharge: 2018-09-09 | Disposition: A | Discharge status: Home / Self Care | Payer: Managed Care, Other (non HMO) | Source: Ambulatory Visit | Attending: General Surgery | Admitting: General Surgery

## 2018-09-09 ENCOUNTER — Ambulatory Visit: Payer: Managed Care, Other (non HMO)

## 2018-09-09 ENCOUNTER — Other Ambulatory Visit: Payer: Self-pay | Admitting: General Surgery

## 2018-09-09 DIAGNOSIS — R921 Mammographic calcification found on diagnostic imaging of breast: Secondary | ICD-10-CM

## 2018-11-02 ENCOUNTER — Other Ambulatory Visit: Payer: Self-pay

## 2018-11-02 ENCOUNTER — Ambulatory Visit (INDEPENDENT_AMBULATORY_CARE_PROVIDER_SITE_OTHER): Payer: Managed Care, Other (non HMO) | Admitting: Family Medicine

## 2018-11-02 ENCOUNTER — Encounter: Payer: Self-pay | Admitting: Family Medicine

## 2018-11-02 VITALS — BP 138/87 | HR 72 | Temp 98.4°F | Ht 63.0 in | Wt 181.0 lb

## 2018-11-02 DIAGNOSIS — Z780 Asymptomatic menopausal state: Secondary | ICD-10-CM

## 2018-11-02 DIAGNOSIS — E119 Type 2 diabetes mellitus without complications: Secondary | ICD-10-CM | POA: Diagnosis not present

## 2018-11-02 DIAGNOSIS — G43709 Chronic migraine without aura, not intractable, without status migrainosus: Secondary | ICD-10-CM

## 2018-11-02 DIAGNOSIS — E559 Vitamin D deficiency, unspecified: Secondary | ICD-10-CM

## 2018-11-02 DIAGNOSIS — E7800 Pure hypercholesterolemia, unspecified: Secondary | ICD-10-CM

## 2018-11-02 DIAGNOSIS — I10 Essential (primary) hypertension: Secondary | ICD-10-CM | POA: Diagnosis not present

## 2018-11-02 LAB — HEMOGLOBIN A1C
Est. average glucose Bld gHb Est-mCnc: 148 mg/dL
Hgb A1c MFr Bld: 6.8 % — ABNORMAL HIGH (ref 4.8–5.6)

## 2018-11-02 MED ORDER — RIZATRIPTAN BENZOATE 5 MG PO TABS: 5.0000 mg | ORAL_TABLET | ORAL | 0 refills | Status: DC | PRN

## 2018-11-02 MED ORDER — NAPROXEN 500 MG PO TBEC: 500.0000 mg | DELAYED_RELEASE_TABLET | Freq: Two times a day (BID) | ORAL | 0 refills | Status: DC | PRN

## 2018-11-02 MED ORDER — GLIMEPIRIDE 1 MG PO TABS: 1.0000 mg | ORAL_TABLET | Freq: Every day | ORAL | 1 refills | Status: DC

## 2018-11-02 NOTE — Addendum Note (Signed)
Addended by: Ileana Roup on: 11/02/2018 09:29 AM   Modules accepted: Orders

## 2018-11-02 NOTE — Patient Instructions (Signed)
° ° ° °  If you have lab work done today you will be contacted with your lab results within the next 2 weeks.  If you have not heard from us then please contact us. The fastest way to get your results is to register for My Chart. ° ° °IF you received an x-ray today, you will receive an invoice from [facility]. Please contact [facility] at [phone] with questions or concerns regarding your invoice.  ° °IF you received labwork today, you will receive an invoice from [facility]. Please contact [facility] at [phone] with questions or concerns regarding your invoice.  ° °Our billing staff will not be able to assist you with questions regarding bills from these companies. ° °You will be contacted with the lab results as soon as they are available. The fastest way to get your results is to activate your My Chart account. Instructions are located on the last page of this paperwork. If you have not heard from us regarding the results in 2 weeks, please contact this office. °  ° ° ° °
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
## 2018-11-03 LAB — CMP14+EGFR
ALT: 19 IU/L (ref 0–32)
AST: 19 IU/L (ref 0–40)
Albumin/Globulin Ratio: 1.7 (ref 1.2–2.2)
Albumin: 4.3 g/dL (ref 3.8–4.8)
Alkaline Phosphatase: 78 IU/L (ref 39–117)
BUN/Creatinine Ratio: 12 (ref 12–28)
BUN: 13 mg/dL (ref 8–27)
Bilirubin Total: 0.4 mg/dL (ref 0.0–1.2)
CO2: 27 mmol/L (ref 20–29)
Calcium: 9.9 mg/dL (ref 8.7–10.3)
Chloride: 100 mmol/L (ref 96–106)
Creatinine, Ser: 1.06 mg/dL — ABNORMAL HIGH (ref 0.57–1.00)
GFR calc Af Amer: 64 mL/min/{1.73_m2} (ref >59)
GFR calc non Af Amer: 55 mL/min/{1.73_m2} — ABNORMAL LOW (ref >59)
Globulin, Total: 2.6 g/dL (ref 1.5–4.5)
Glucose: 127 mg/dL — ABNORMAL HIGH (ref 65–99)
Potassium: 4.1 mmol/L (ref 3.5–5.2)
Sodium: 140 mmol/L (ref 134–144)
Total Protein: 6.9 g/dL (ref 6.0–8.5)

## 2018-11-03 LAB — LIPID PANEL
Chol/HDL Ratio: 4 ratio (ref 0.0–4.4)
Cholesterol, Total: 219 mg/dL — ABNORMAL HIGH (ref 100–199)
HDL: 55 mg/dL (ref >39)
LDL Calculated: 146 mg/dL — ABNORMAL HIGH (ref 0–99)
Triglycerides: 89 mg/dL (ref 0–149)
VLDL Cholesterol Cal: 18 mg/dL (ref 5–40)

## 2018-11-03 LAB — VITAMIN D 25 HYDROXY (VIT D DEFICIENCY, FRACTURES): Vit D, 25-Hydroxy: 15.8 ng/mL — ABNORMAL LOW (ref 30.0–100.0)

## 2018-11-03 LAB — MICROALBUMIN, URINE: Microalbumin, Urine: 15.2 ug/mL

## 2018-11-04 ENCOUNTER — Other Ambulatory Visit: Payer: Self-pay | Admitting: Family Medicine

## 2018-11-04 DIAGNOSIS — E7800 Pure hypercholesterolemia, unspecified: Secondary | ICD-10-CM

## 2018-11-04 DIAGNOSIS — E1122 Type 2 diabetes mellitus with diabetic chronic kidney disease: Secondary | ICD-10-CM

## 2018-11-04 MED ORDER — ATORVASTATIN CALCIUM 40 MG PO TABS: 40.0000 mg | ORAL_TABLET | Freq: Every day | ORAL | 3 refills | Status: DC

## 2018-11-04 MED ORDER — VITAMIN D (ERGOCALCIFEROL) 1.25 MG (50000 UNIT) PO CAPS: 50000.0000 [IU] | ORAL_CAPSULE | ORAL | 0 refills | Status: DC

## 2018-11-04 MED ORDER — GLIMEPIRIDE 2 MG PO TABS: 2.0000 mg | ORAL_TABLET | Freq: Every day | ORAL | 1 refills | Status: DC

## 2018-12-08 ENCOUNTER — Encounter: Payer: Self-pay | Admitting: Gastroenterology

## 2018-12-18 ENCOUNTER — Other Ambulatory Visit: Payer: Self-pay | Admitting: Family Medicine

## 2018-12-18 DIAGNOSIS — E1122 Type 2 diabetes mellitus with diabetic chronic kidney disease: Secondary | ICD-10-CM

## 2018-12-19 NOTE — Telephone Encounter (Signed)
Requested Prescriptions  Pending Prescriptions Disp Refills  . metFORMIN (GLUCOPHAGE-XR) 750 MG 24 hr tablet [Pharmacy Med Name: METFORMIN ER '750MG'$  TABLET] 180 tablet 0    Sig: TAKE 2 TABLETS BY MOUTH  DAILY WITH BREAKFAST     Endocrinology:  Diabetes - Biguanides Failed - 12/18/2018  9:17 PM      Failed - Cr in normal range and within 360 days    Creatinine, Ser  Date Value Ref Range Status  11/02/2018 1.06 (H) 0.57 - 1.00 mg/dL Final         Passed - HBA1C is between 0 and 7.9 and within 180 days    Hgb A1c MFr Bld  Date Value Ref Range Status  11/02/2018 6.8 (H) 4.8 - 5.6 % Final    Comment:             Prediabetes: 5.7 - 6.4          Diabetes: >6.4          Glycemic control for adults with diabetes: <7.0          Passed - eGFR in normal range and within 360 days    GFR calc Af Amer  Date Value Ref Range Status  11/02/2018 64 >59 mL/min/1.73 Final   GFR calc non Af Amer  Date Value Ref Range Status  11/02/2018 55 (L) >59 mL/min/1.73 Final         Passed - Valid encounter within last 6 months    Recent Outpatient Visits          1 month ago Type 2 diabetes mellitus without complication, without long-term current use of insulin ([place])   [facility], Lilia Argue, MD   4 months ago Elevated LDL cholesterol level   [facility], Ranell Patrick, MD   1 year ago Type 2 diabetes mellitus with hyperglycemia, without long-term current use of insulin [facility])   [facility], Laurey Arrow, MD   1 year ago Acute pain of right shoulder   Primary Care at [facility], [place], MD   1 year ago Type 2 diabetes mellitus without complication, without long-term current use of insulin [facility])   [facility] at [facility], Laurey Arrow, MD      Future Appointments            In 1 month Rutherford Guys, MD Primary [facility] at [place], [facility]           Patient has follow up appointment with Dr. Pamella Pert 01/30/2019.

## 2019-01-12 ENCOUNTER — Other Ambulatory Visit: Payer: Self-pay | Admitting: General Surgery

## 2019-01-12 ENCOUNTER — Ambulatory Visit
Admission: RE | Admit: 2019-01-12 | Discharge: 2019-01-12 | Disposition: A | Discharge status: Home / Self Care | Payer: Managed Care, Other (non HMO) | Source: Ambulatory Visit | Attending: General Surgery | Admitting: General Surgery

## 2019-01-12 ENCOUNTER — Other Ambulatory Visit: Payer: Self-pay

## 2019-01-12 DIAGNOSIS — Z1231 Encounter for screening mammogram for malignant neoplasm of breast: Secondary | ICD-10-CM

## 2019-01-12 DIAGNOSIS — R921 Mammographic calcification found on diagnostic imaging of breast: Secondary | ICD-10-CM

## 2019-01-30 ENCOUNTER — Other Ambulatory Visit: Payer: Self-pay

## 2019-01-30 ENCOUNTER — Ambulatory Visit (INDEPENDENT_AMBULATORY_CARE_PROVIDER_SITE_OTHER): Payer: Managed Care, Other (non HMO) | Admitting: Family Medicine

## 2019-01-30 ENCOUNTER — Encounter: Payer: Self-pay | Admitting: Family Medicine

## 2019-01-30 VITALS — BP 140/84 | HR 86 | Temp 98.7°F | Ht 63.0 in | Wt 179.0 lb

## 2019-01-30 DIAGNOSIS — I10 Essential (primary) hypertension: Secondary | ICD-10-CM

## 2019-01-30 DIAGNOSIS — E559 Vitamin D deficiency, unspecified: Secondary | ICD-10-CM | POA: Diagnosis not present

## 2019-01-30 DIAGNOSIS — Z1211 Encounter for screening for malignant neoplasm of colon: Secondary | ICD-10-CM

## 2019-01-30 DIAGNOSIS — E119 Type 2 diabetes mellitus without complications: Secondary | ICD-10-CM

## 2019-01-30 DIAGNOSIS — E7800 Pure hypercholesterolemia, unspecified: Secondary | ICD-10-CM | POA: Diagnosis not present

## 2019-01-30 MED ORDER — GLUCOSE BLOOD VI STRP: ORAL_STRIP | 3 refills | Status: DC

## 2019-01-30 MED ORDER — AMLODIPINE-OLMESARTAN 10-40 MG PO TABS: 1.0000 | ORAL_TABLET | Freq: Every day | ORAL | 3 refills | Status: DC

## 2019-01-30 NOTE — Patient Instructions (Signed)
° ° ° °  If you have lab work done today you will be contacted with your lab results within the next 2 weeks.  If you have not heard from us then please contact us. The fastest way to get your results is to register for My Chart. ° ° °IF you received an x-ray today, you will receive an invoice from [facility]. Please contact [facility] at [phone] with questions or concerns regarding your invoice.  ° °IF you received labwork today, you will receive an invoice from [facility]. Please contact [facility] at [phone] with questions or concerns regarding your invoice.  ° °Our billing staff will not be able to assist you with questions regarding bills from these companies. ° °You will be contacted with the lab results as soon as they are available. The fastest way to get your results is to activate your My Chart account. Instructions are located on the last page of this paperwork. If you have not heard from us regarding the results in 2 weeks, please contact this office. °  ° ° ° °
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
## 2019-01-31 LAB — COMPREHENSIVE METABOLIC PANEL
ALT: 18 IU/L (ref 0–32)
AST: 19 IU/L (ref 0–40)
Albumin/Globulin Ratio: 2.1 (ref 1.2–2.2)
Albumin: 4.8 g/dL (ref 3.8–4.8)
Alkaline Phosphatase: 79 IU/L (ref 39–117)
BUN/Creatinine Ratio: 11 — ABNORMAL LOW (ref 12–28)
BUN: 11 mg/dL (ref 8–27)
Bilirubin Total: 0.5 mg/dL (ref 0.0–1.2)
CO2: 25 mmol/L (ref 20–29)
Calcium: 10 mg/dL (ref 8.7–10.3)
Chloride: 103 mmol/L (ref 96–106)
Creatinine, Ser: 1.04 mg/dL — ABNORMAL HIGH (ref 0.57–1.00)
GFR calc Af Amer: 65 mL/min/{1.73_m2} (ref >59)
GFR calc non Af Amer: 57 mL/min/{1.73_m2} — ABNORMAL LOW (ref >59)
Globulin, Total: 2.3 g/dL (ref 1.5–4.5)
Glucose: 158 mg/dL — ABNORMAL HIGH (ref 65–99)
Potassium: 4.4 mmol/L (ref 3.5–5.2)
Sodium: 143 mmol/L (ref 134–144)
Total Protein: 7.1 g/dL (ref 6.0–8.5)

## 2019-01-31 LAB — LIPID PANEL
Chol/HDL Ratio: 4 ratio (ref 0.0–4.4)
Cholesterol, Total: 221 mg/dL — ABNORMAL HIGH (ref 100–199)
HDL: 55 mg/dL (ref >39)
LDL Chol Calc (NIH): 157 mg/dL — ABNORMAL HIGH (ref 0–99)
Triglycerides: 55 mg/dL (ref 0–149)
VLDL Cholesterol Cal: 9 mg/dL (ref 5–40)

## 2019-01-31 LAB — HEMOGLOBIN A1C
Est. average glucose Bld gHb Est-mCnc: 137 mg/dL
Hgb A1c MFr Bld: 6.4 % — ABNORMAL HIGH (ref 4.8–5.6)

## 2019-01-31 LAB — VITAMIN D 25 HYDROXY (VIT D DEFICIENCY, FRACTURES): Vit D, 25-Hydroxy: 28.2 ng/mL — ABNORMAL LOW (ref 30.0–100.0)

## 2019-02-14 ENCOUNTER — Encounter: Payer: Self-pay | Admitting: Radiology

## 2019-02-28 LAB — HM DIABETES EYE EXAM

## 2019-03-10 ENCOUNTER — Telehealth: Payer: Self-pay | Admitting: Family Medicine

## 2019-03-10 ENCOUNTER — Other Ambulatory Visit: Payer: Self-pay | Admitting: Family Medicine

## 2019-03-10 DIAGNOSIS — E1122 Type 2 diabetes mellitus with diabetic chronic kidney disease: Secondary | ICD-10-CM

## 2019-03-10 DIAGNOSIS — N182 Chronic kidney disease, stage 2 (mild): Secondary | ICD-10-CM

## 2019-03-10 NOTE — Telephone Encounter (Signed)
Patient received a email from [employer] metFORMIN (GLUCOPHAGE-XR) 750 MG 24 hr tablet is on recall, patient seeking clinical advice regarding an alternate and should she d/c, pleas advise     [place], [place] [phone] (Phone) [phone] (Fax)

## 2019-03-10 NOTE — Telephone Encounter (Signed)
Forwarding medication refill request to the clinical pool for review. 

## 2019-03-15 NOTE — Telephone Encounter (Signed)
Pt spoke with Chelsea at [facility] in reference to her med refill. Pt is requesting a cb with status update

## 2019-03-23 ENCOUNTER — Encounter: Payer: Self-pay | Admitting: *Deleted

## 2019-03-28 NOTE — Telephone Encounter (Signed)
Refill has been sent.  °

## 2019-05-01 ENCOUNTER — Other Ambulatory Visit: Payer: Self-pay

## 2019-05-01 ENCOUNTER — Encounter: Payer: Self-pay | Admitting: Family Medicine

## 2019-05-01 ENCOUNTER — Ambulatory Visit (INDEPENDENT_AMBULATORY_CARE_PROVIDER_SITE_OTHER): Payer: Managed Care, Other (non HMO) | Admitting: Family Medicine

## 2019-05-01 VITALS — BP 114/75 | HR 71 | Temp 97.4°F | Ht 63.0 in | Wt 183.6 lb

## 2019-05-01 DIAGNOSIS — I10 Essential (primary) hypertension: Secondary | ICD-10-CM

## 2019-05-01 DIAGNOSIS — E7800 Pure hypercholesterolemia, unspecified: Secondary | ICD-10-CM

## 2019-05-01 DIAGNOSIS — E559 Vitamin D deficiency, unspecified: Secondary | ICD-10-CM

## 2019-05-01 DIAGNOSIS — E119 Type 2 diabetes mellitus without complications: Secondary | ICD-10-CM

## 2019-05-01 MED ORDER — AMLODIPINE-OLMESARTAN 10-40 MG PO TABS: 1.0000 | ORAL_TABLET | Freq: Every day | ORAL | 3 refills | Status: DC

## 2019-05-01 MED ORDER — GLUCOSE BLOOD VI STRP: ORAL_STRIP | 3 refills | Status: DC

## 2019-05-01 MED ORDER — VITAMIN D 25 MCG (1000 UNIT) PO TABS: 1000.0000 [IU] | ORAL_TABLET | Freq: Every day | ORAL | Status: DC

## 2019-05-01 NOTE — Patient Instructions (Addendum)
  Please call [facility] and schedule your bone density.   Please call me back with name of lancets you use.    If you have lab work done today you will be contacted with your lab results within the next 2 weeks.  If you have not heard from [place] then please contact us. The fastest way to get your results is to register for My Chart.   IF you received an x-ray today, you will receive an invoice from [facility]. Please contact [facility] at [phone] with questions or concerns regarding your invoice.   IF you received labwork today, you will receive an invoice from [facility]. Please contact [facility] at [phone] with questions or concerns regarding your invoice.   Our billing staff will not be able to assist you with questions regarding bills from these companies.  You will be contacted with the lab results as soon as they are available. The fastest way to get your results is to activate your My Chart account. Instructions are located on the last page of this paperwork. If you have not heard from [place] regarding the results in 2 weeks, please contact this office.
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
## 2019-05-02 LAB — CMP14+EGFR
ALT: 16 IU/L (ref 0–32)
AST: 18 IU/L (ref 0–40)
Albumin/Globulin Ratio: 2 (ref 1.2–2.2)
Albumin: 4.3 g/dL (ref 3.8–4.8)
Alkaline Phosphatase: 82 IU/L (ref 39–117)
BUN/Creatinine Ratio: 18 (ref 12–28)
BUN: 18 mg/dL (ref 8–27)
Bilirubin Total: 0.3 mg/dL (ref 0.0–1.2)
CO2: 20 mmol/L (ref 20–29)
Calcium: 10.2 mg/dL (ref 8.7–10.3)
Chloride: 106 mmol/L (ref 96–106)
Creatinine, Ser: 1 mg/dL (ref 0.57–1.00)
GFR calc Af Amer: 68 mL/min/{1.73_m2} (ref >59)
GFR calc non Af Amer: 59 mL/min/{1.73_m2} — ABNORMAL LOW (ref >59)
Globulin, Total: 2.1 g/dL (ref 1.5–4.5)
Glucose: 136 mg/dL — ABNORMAL HIGH (ref 65–99)
Potassium: 4.3 mmol/L (ref 3.5–5.2)
Sodium: 142 mmol/L (ref 134–144)
Total Protein: 6.4 g/dL (ref 6.0–8.5)

## 2019-05-02 LAB — LIPID PANEL
Chol/HDL Ratio: 2.6 ratio (ref 0.0–4.4)
Cholesterol, Total: 150 mg/dL (ref 100–199)
HDL: 58 mg/dL (ref >39)
LDL Chol Calc (NIH): 77 mg/dL (ref 0–99)
Triglycerides: 81 mg/dL (ref 0–149)
VLDL Cholesterol Cal: 15 mg/dL (ref 5–40)

## 2019-05-02 LAB — VITAMIN D 25 HYDROXY (VIT D DEFICIENCY, FRACTURES): Vit D, 25-Hydroxy: 40.5 ng/mL (ref 30.0–100.0)

## 2019-05-04 ENCOUNTER — Encounter: Payer: Self-pay | Admitting: Radiology

## 2019-05-16 ENCOUNTER — Telehealth (INDEPENDENT_AMBULATORY_CARE_PROVIDER_SITE_OTHER): Payer: Managed Care, Other (non HMO) | Admitting: Family Medicine

## 2019-05-16 ENCOUNTER — Encounter: Payer: Self-pay | Admitting: Family Medicine

## 2019-05-16 ENCOUNTER — Other Ambulatory Visit: Payer: Self-pay

## 2019-05-16 VITALS — BP 137/81 | HR 96 | Temp 97.7°F | Ht 63.0 in | Wt 185.4 lb

## 2019-05-16 DIAGNOSIS — B029 Zoster without complications: Secondary | ICD-10-CM | POA: Diagnosis not present

## 2019-05-16 NOTE — Patient Instructions (Signed)
° ° ° °  If you have lab work done today you will be contacted with your lab results within the next 2 weeks.  If you have not heard from us then please contact us. The fastest way to get your results is to register for My Chart. ° ° °IF you received an x-ray today, you will receive an invoice from [facility]. Please contact [facility] at [phone] with questions or concerns regarding your invoice.  ° °IF you received labwork today, you will receive an invoice from [facility]. Please contact [facility] at [phone] with questions or concerns regarding your invoice.  ° °Our billing staff will not be able to assist you with questions regarding bills from these companies. ° °You will be contacted with the lab results as soon as they are available. The fastest way to get your results is to activate your My Chart account. Instructions are located on the last page of this paperwork. If you have not heard from us regarding the results in 2 weeks, please contact this office. °  ° ° ° °
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
## 2019-05-16 NOTE — Progress Notes (Signed)
Last few days she has been stumbling for the last couple of days.   The cluster HA has been going on since 2 wks. And it does feel like a sinus pain and took an allergy med and went to sleep to help. Deep breathing helped her work through the pain.   Fasting Blood sugar today 145  Right side of the face has a rash starting from eyebrow through scalp (going up). Right eye swollen. Started around [place].

## 2019-06-13 ENCOUNTER — Other Ambulatory Visit: Payer: Self-pay | Admitting: Specialist

## 2019-06-13 DIAGNOSIS — G44009 Cluster headache syndrome, unspecified, not intractable: Secondary | ICD-10-CM

## 2019-07-05 ENCOUNTER — Other Ambulatory Visit: Payer: Managed Care, Other (non HMO)

## 2019-07-06 ENCOUNTER — Ambulatory Visit
Admission: RE | Admit: 2019-07-06 | Discharge: 2019-07-06 | Disposition: A | Discharge status: Home / Self Care | Payer: Managed Care, Other (non HMO) | Source: Ambulatory Visit | Attending: Specialist | Admitting: Specialist

## 2019-07-06 ENCOUNTER — Other Ambulatory Visit: Payer: Self-pay

## 2019-07-06 DIAGNOSIS — G44009 Cluster headache syndrome, unspecified, not intractable: Secondary | ICD-10-CM

## 2019-07-06 MED ORDER — GADOBENATE DIMEGLUMINE 529 MG/ML IV SOLN
16.0000 mL | Freq: Once | INTRAVENOUS | Status: AC | PRN
  Administered 2019-07-06: 16 mL via INTRAVENOUS

## 2019-07-31 ENCOUNTER — Other Ambulatory Visit: Payer: Self-pay

## 2019-07-31 ENCOUNTER — Encounter: Payer: Self-pay | Admitting: Family Medicine

## 2019-07-31 ENCOUNTER — Ambulatory Visit (INDEPENDENT_AMBULATORY_CARE_PROVIDER_SITE_OTHER): Payer: Managed Care, Other (non HMO) | Admitting: Family Medicine

## 2019-07-31 VITALS — BP 130/80 | HR 75 | Temp 97.5°F | Ht 63.0 in | Wt 173.6 lb

## 2019-07-31 DIAGNOSIS — I10 Essential (primary) hypertension: Secondary | ICD-10-CM

## 2019-07-31 DIAGNOSIS — E1121 Type 2 diabetes mellitus with diabetic nephropathy: Secondary | ICD-10-CM | POA: Diagnosis not present

## 2019-07-31 DIAGNOSIS — N1831 Chronic kidney disease, stage 3a: Secondary | ICD-10-CM

## 2019-07-31 DIAGNOSIS — E7800 Pure hypercholesterolemia, unspecified: Secondary | ICD-10-CM

## 2019-07-31 LAB — BASIC METABOLIC PANEL
BUN/Creatinine Ratio: 15 (ref 12–28)
BUN: 17 mg/dL (ref 8–27)
CO2: 22 mmol/L (ref 20–29)
Calcium: 10.2 mg/dL (ref 8.7–10.3)
Chloride: 108 mmol/L — ABNORMAL HIGH (ref 96–106)
Creatinine, Ser: 1.16 mg/dL — ABNORMAL HIGH (ref 0.57–1.00)
GFR calc Af Amer: 57 mL/min/{1.73_m2} — ABNORMAL LOW (ref >59)
GFR calc non Af Amer: 50 mL/min/{1.73_m2} — ABNORMAL LOW (ref >59)
Glucose: 130 mg/dL — ABNORMAL HIGH (ref 65–99)
Potassium: 3.9 mmol/L (ref 3.5–5.2)
Sodium: 145 mmol/L — ABNORMAL HIGH (ref 134–144)

## 2019-07-31 LAB — HEMOGLOBIN A1C
Est. average glucose Bld gHb Est-mCnc: 131 mg/dL
Hgb A1c MFr Bld: 6.2 % — ABNORMAL HIGH (ref 4.8–5.6)
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
## 2019-07-31 NOTE — Patient Instructions (Signed)
° ° ° °  If you have lab work done today you will be contacted with your lab results within the next 2 weeks.  If you have not heard from us then please contact us. The fastest way to get your results is to register for My Chart. ° ° °IF you received an x-ray today, you will receive an invoice from [facility]. Please contact [facility] at [phone] with questions or concerns regarding your invoice.  ° °IF you received labwork today, you will receive an invoice from [facility]. Please contact [facility] at [phone] with questions or concerns regarding your invoice.  ° °Our billing staff will not be able to assist you with questions regarding bills from these companies. ° °You will be contacted with the lab results as soon as they are available. The fastest way to get your results is to activate your My Chart account. Instructions are located on the last page of this paperwork. If you have not heard from us regarding the results in 2 weeks, please contact this office. °  ° ° ° °

## 2020-01-10 ENCOUNTER — Other Ambulatory Visit: Payer: Self-pay | Admitting: Obstetrics and Gynecology

## 2020-01-10 DIAGNOSIS — Z1231 Encounter for screening mammogram for malignant neoplasm of breast: Secondary | ICD-10-CM

## 2020-01-16 ENCOUNTER — Ambulatory Visit
Admission: RE | Admit: 2020-01-16 | Discharge: 2020-01-16 | Disposition: A | Discharge status: Home / Self Care | Payer: Managed Care, Other (non HMO) | Source: Ambulatory Visit | Attending: Obstetrics and Gynecology | Admitting: Obstetrics and Gynecology

## 2020-01-16 ENCOUNTER — Other Ambulatory Visit: Payer: Self-pay

## 2020-01-16 DIAGNOSIS — Z1231 Encounter for screening mammogram for malignant neoplasm of breast: Secondary | ICD-10-CM

## 2020-01-27 ENCOUNTER — Other Ambulatory Visit: Payer: Self-pay | Admitting: Family Medicine

## 2020-01-27 DIAGNOSIS — N182 Chronic kidney disease, stage 2 (mild): Secondary | ICD-10-CM

## 2020-02-02 ENCOUNTER — Other Ambulatory Visit: Payer: Self-pay

## 2020-02-02 ENCOUNTER — Ambulatory Visit (INDEPENDENT_AMBULATORY_CARE_PROVIDER_SITE_OTHER): Payer: Managed Care, Other (non HMO) | Admitting: Family Medicine

## 2020-02-02 ENCOUNTER — Encounter: Payer: Self-pay | Admitting: Family Medicine

## 2020-02-02 VITALS — BP 119/86 | HR 85 | Temp 97.6°F | Ht 63.0 in | Wt 167.2 lb

## 2020-02-02 DIAGNOSIS — E1121 Type 2 diabetes mellitus with diabetic nephropathy: Secondary | ICD-10-CM | POA: Diagnosis not present

## 2020-02-02 DIAGNOSIS — A6000 Herpesviral infection of urogenital system, unspecified: Secondary | ICD-10-CM | POA: Insufficient documentation

## 2020-02-02 DIAGNOSIS — I10 Essential (primary) hypertension: Secondary | ICD-10-CM | POA: Diagnosis not present

## 2020-02-02 DIAGNOSIS — E559 Vitamin D deficiency, unspecified: Secondary | ICD-10-CM | POA: Diagnosis not present

## 2020-02-02 DIAGNOSIS — E7800 Pure hypercholesterolemia, unspecified: Secondary | ICD-10-CM

## 2020-02-02 DIAGNOSIS — R0683 Snoring: Secondary | ICD-10-CM

## 2020-02-02 DIAGNOSIS — N1831 Chronic kidney disease, stage 3a: Secondary | ICD-10-CM

## 2020-02-02 DIAGNOSIS — E1122 Type 2 diabetes mellitus with diabetic chronic kidney disease: Secondary | ICD-10-CM

## 2020-02-02 MED ORDER — ATORVASTATIN CALCIUM 40 MG PO TABS: 40.0000 mg | ORAL_TABLET | Freq: Every day | ORAL | 3 refills | Status: DC

## 2020-02-02 MED ORDER — AMLODIPINE-OLMESARTAN 10-40 MG PO TABS: 1.0000 | ORAL_TABLET | Freq: Every day | ORAL | 3 refills | Status: DC

## 2020-02-02 MED ORDER — METFORMIN HCL ER 750 MG PO TB24: ORAL_TABLET | ORAL | 3 refills | Status: DC

## 2020-02-02 MED ORDER — VITAMIN D 25 MCG (1000 UNIT) PO TABS
1000.0000 [IU] | ORAL_TABLET | Freq: Every day | ORAL | Status: DC
Start: 2020-02-02 — End: 2021-04-17
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
## 2020-02-02 NOTE — Patient Instructions (Signed)
° ° ° °  If you have lab work done today you will be contacted with your lab results within the next 2 weeks.  If you have not heard from us then please contact us. The fastest way to get your results is to register for My Chart. ° ° °IF you received an x-ray today, you will receive an invoice from [facility]. Please contact  Radiology at [phone] with questions or concerns regarding your invoice.  ° °IF you received labwork today, you will receive an invoice from [facility]. Please contact [facility] at [phone] with questions or concerns regarding your invoice.  ° °Our billing staff will not be able to assist you with questions regarding bills from these companies. ° °You will be contacted with the lab results as soon as they are available. The fastest way to get your results is to activate your My Chart account. Instructions are located on the last page of this paperwork. If you have not heard from us regarding the results in 2 weeks, please contact this office. °  ° ° ° °

## 2020-02-03 LAB — CMP14+EGFR
ALT: 17 IU/L (ref 0–32)
AST: 15 IU/L (ref 0–40)
Albumin/Globulin Ratio: 1.8 (ref 1.2–2.2)
Albumin: 4.7 g/dL (ref 3.8–4.8)
Alkaline Phosphatase: 99 IU/L (ref 44–121)
BUN/Creatinine Ratio: 13 (ref 12–28)
BUN: 18 mg/dL (ref 8–27)
Bilirubin Total: 0.3 mg/dL (ref 0.0–1.2)
CO2: 22 mmol/L (ref 20–29)
Calcium: 10.8 mg/dL — ABNORMAL HIGH (ref 8.7–10.3)
Chloride: 105 mmol/L (ref 96–106)
Creatinine, Ser: 1.35 mg/dL — ABNORMAL HIGH (ref 0.57–1.00)
GFR calc Af Amer: 47 mL/min/{1.73_m2} — ABNORMAL LOW (ref >59)
GFR calc non Af Amer: 41 mL/min/{1.73_m2} — ABNORMAL LOW (ref >59)
Globulin, Total: 2.6 g/dL (ref 1.5–4.5)
Glucose: 129 mg/dL — ABNORMAL HIGH (ref 65–99)
Potassium: 4.4 mmol/L (ref 3.5–5.2)
Sodium: 143 mmol/L (ref 134–144)
Total Protein: 7.3 g/dL (ref 6.0–8.5)

## 2020-02-03 LAB — LIPID PANEL
Chol/HDL Ratio: 3.4 ratio (ref 0.0–4.4)
Cholesterol, Total: 182 mg/dL (ref 100–199)
HDL: 54 mg/dL (ref >39)
LDL Chol Calc (NIH): 112 mg/dL — ABNORMAL HIGH (ref 0–99)
Triglycerides: 88 mg/dL (ref 0–149)
VLDL Cholesterol Cal: 16 mg/dL (ref 5–40)

## 2020-02-03 LAB — HEMOGLOBIN A1C
Est. average glucose Bld gHb Est-mCnc: 134 mg/dL
Hgb A1c MFr Bld: 6.3 % — ABNORMAL HIGH (ref 4.8–5.6)

## 2020-02-03 LAB — VITAMIN D 25 HYDROXY (VIT D DEFICIENCY, FRACTURES): Vit D, 25-Hydroxy: 32 ng/mL (ref 30.0–100.0)

## 2020-02-20 IMAGING — MG STEREOTACTIC VACUUM ASSIST RIGHT
8 of 16 series · 8 of 32 positions shown · non-contrast
Comparison: Previous exams.

ADDENDUM:
Pathology revealed INTRADUCTAL PAPILLOMA WITH MICROCALCIFICATIONS of
RIGHT breast, upper inner quadrant, ribbon clip. This was found to
be concordant by Dr. Mouba Boukari, with excision recommended.

Pathology results were discussed with the patient by telephone. The
patient reported doing well after the biopsy with tenderness at the
site. Post biopsy instructions and care were reviewed and questions
were answered. The patient was encouraged to call The [REDACTED]
The patient has a recent diagnosis of two areas of RIGHT breast
atypical ductal hyperplasia and should follow her outlined treatment
plan. I have contacted Dr Douanson Modeline of [REDACTED], [HOSPITAL][HOSPITAL], via [REDACTED] message with these biopsy results.
Pathology results reported by Kaki Jim, RN on 02/24/2018.
CLINICAL DATA: Patient recently had stereotactic guided core biopsy
of 2 sites of calcifications in the RIGHT breast, both showing
atypical ductal hyperplasia. Patient is sent by Dr. Gularia for
biopsy of additional calcifications in the RIGHT breast.
EXAM:
RIGHT BREAST STEREOTACTIC CORE NEEDLE BIOPSY

[R (1 of 7)]
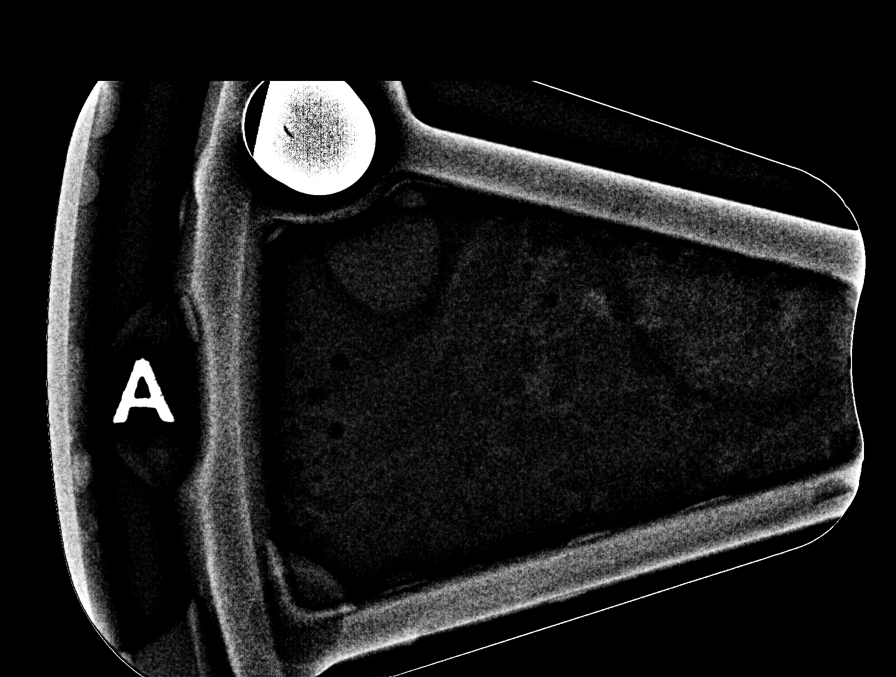

[R (2 of 7)]
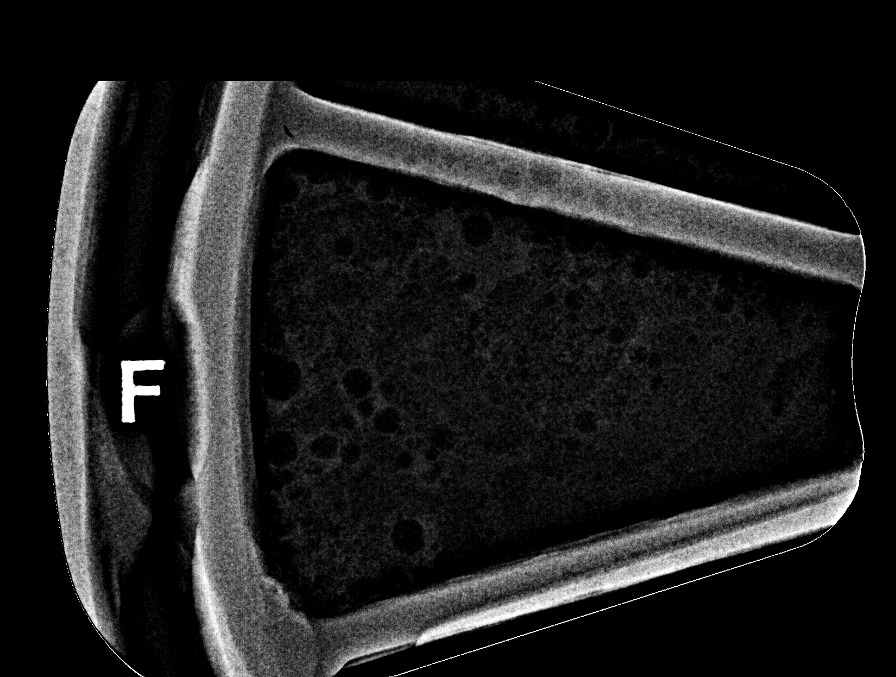

[R (3 of 7)]
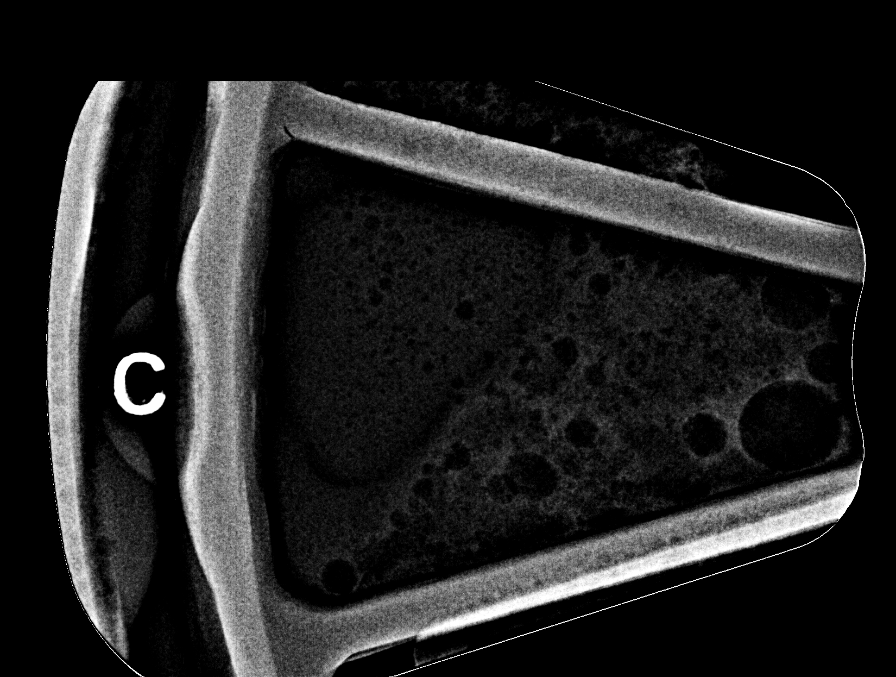

[R (4 of 7)]
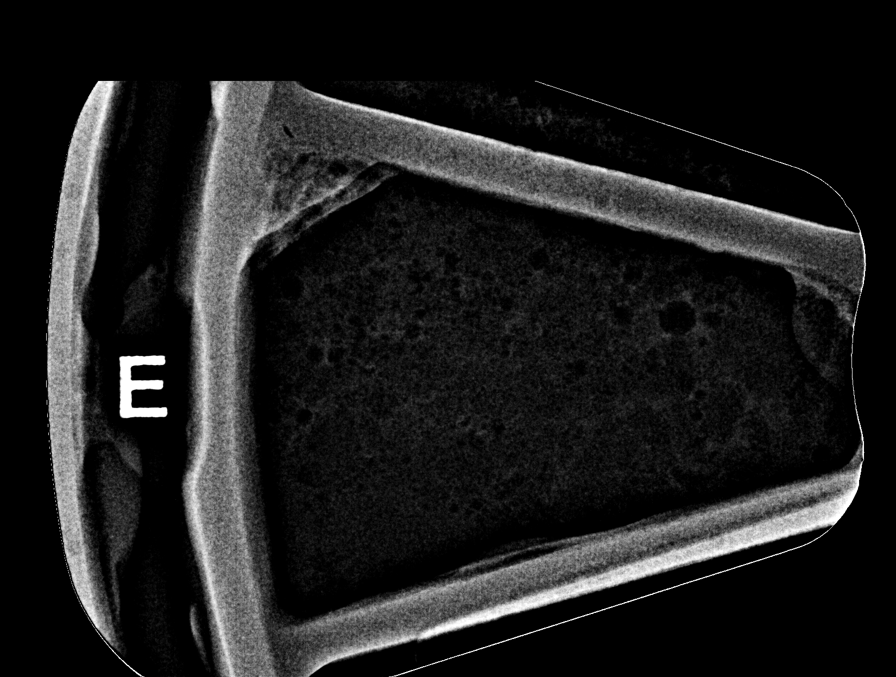

[R (5 of 7)]
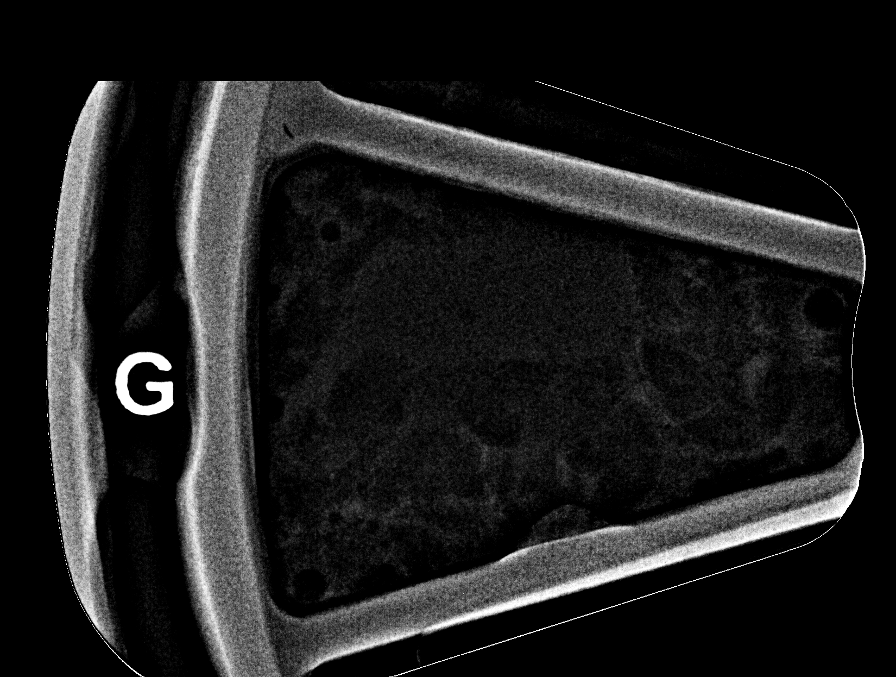

[R (6 of 7)]
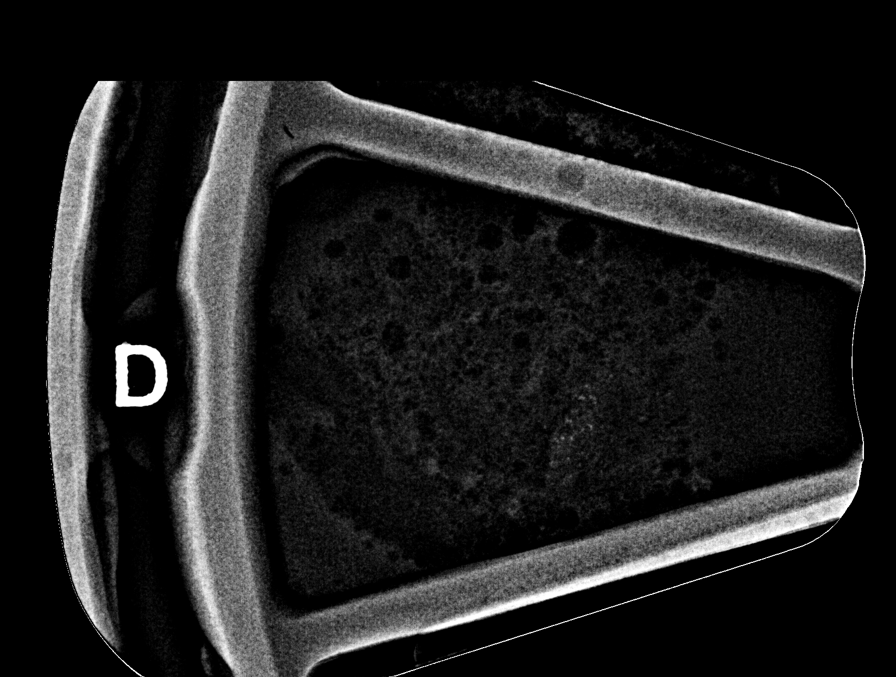

[R (7 of 7)]
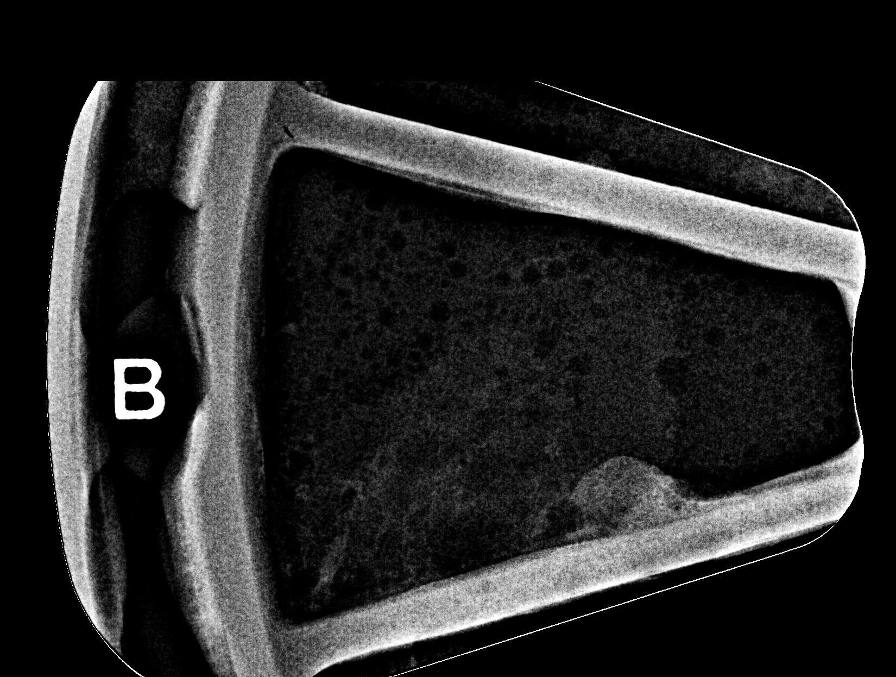

[R CC]
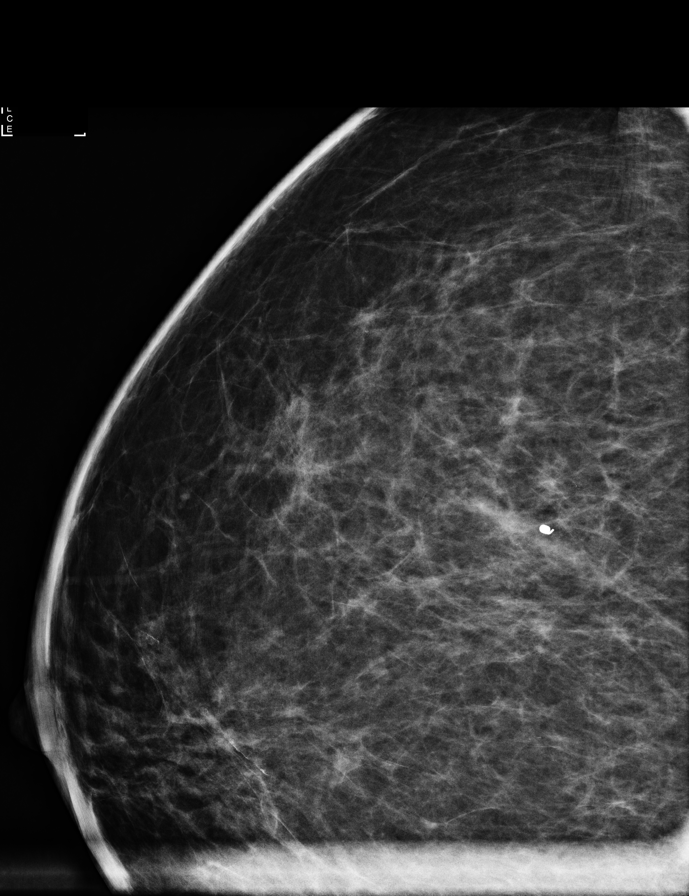

[8 of 32 positions shown; findings below may reference images not displayed]

FINDINGS: Prior to the biopsy, additional images are performed which
demonstrate the X shaped clip in the UPPER INNER QUADRANT of the
RIGHT breast, adjacent to residual calcifications. The clip is
approximately 1.4 centimeters posterior to an additional site of
fine pleomorphic calcifications, which are sampled today. Within the
LATERAL aspect of the RIGHT breast, faint residual calcifications
are identified adjacent to the coil shaped clip. No significant
additional groups of calcifications are identified in the LATERAL
aspect of the breast.

The patient and I discussed the procedure of stereotactic-guided
biopsy including benefits and alternatives. We discussed the high
likelihood of a successful procedure. We discussed the risks of the
procedure including infection, bleeding, tissue injury, clip
migration, and inadequate sampling. Informed written consent was
given. The usual time out protocol was performed immediately prior
to the procedure.

Using sterile technique and 1% Lidocaine as local anesthetic, under
stereotactic guidance, a 9 gauge vacuum assisted device was used to
perform core needle biopsy of calcifications in the UPPER INNER
QUADRANT of the RIGHT breast using a superior to inferior approach.
Specimen radiograph was performed showing calcifications to be
present in a single sample. Specimen with calcifications is
identified for pathology.

Lesion quadrant: UPPER INNER QUADRANT RIGHT breast

At the conclusion of the procedure, a ribbon shaped tissue marker
clip was deployed into the biopsy cavity. Follow-up 2-view mammogram
was performed and dictated separately.
IMPRESSION: Stereotactic-guided biopsy of RIGHT breast calcifications. No
apparent complications.

## 2020-03-01 LAB — HM DIABETES EYE EXAM

## 2020-03-05 ENCOUNTER — Encounter: Payer: Self-pay | Admitting: Neurology

## 2020-03-05 ENCOUNTER — Ambulatory Visit (INDEPENDENT_AMBULATORY_CARE_PROVIDER_SITE_OTHER): Payer: Managed Care, Other (non HMO) | Admitting: Neurology

## 2020-03-05 VITALS — BP 122/78 | HR 72 | Ht 63.0 in | Wt 167.3 lb

## 2020-03-05 DIAGNOSIS — R0683 Snoring: Secondary | ICD-10-CM | POA: Diagnosis not present

## 2020-03-05 DIAGNOSIS — G4719 Other hypersomnia: Secondary | ICD-10-CM | POA: Diagnosis not present

## 2020-03-05 DIAGNOSIS — R0681 Apnea, not elsewhere classified: Secondary | ICD-10-CM | POA: Diagnosis not present

## 2020-03-05 DIAGNOSIS — E663 Overweight: Secondary | ICD-10-CM

## 2020-03-05 DIAGNOSIS — G4700 Insomnia, unspecified: Secondary | ICD-10-CM

## 2020-03-05 NOTE — Patient Instructions (Signed)
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
## 2020-03-20 ENCOUNTER — Ambulatory Visit (INDEPENDENT_AMBULATORY_CARE_PROVIDER_SITE_OTHER): Payer: Medicare Other | Admitting: Neurology

## 2020-03-20 DIAGNOSIS — G4733 Obstructive sleep apnea (adult) (pediatric): Secondary | ICD-10-CM

## 2020-03-20 DIAGNOSIS — E663 Overweight: Secondary | ICD-10-CM

## 2020-03-20 DIAGNOSIS — G4700 Insomnia, unspecified: Secondary | ICD-10-CM

## 2020-03-20 DIAGNOSIS — G4719 Other hypersomnia: Secondary | ICD-10-CM

## 2020-03-20 DIAGNOSIS — R0681 Apnea, not elsewhere classified: Secondary | ICD-10-CM

## 2020-03-20 DIAGNOSIS — R0683 Snoring: Secondary | ICD-10-CM

## 2020-03-26 NOTE — Procedures (Signed)
   [facility]  HOME SLEEP TEST (Watch PAT)  STUDY DATE: 03/20/20  DOB: 03/11/1956  MRN: [id number]  ORDERING CLINICIAN: Star Age, MD, PhD   REFERRING CLINICIAN: Dr. Pamella Pert   CLINICAL INFORMATION/HISTORY: 64 year old woman with a history of reflux disease, hypertension, hyperlipidemia, headaches, diabetes, and overweight state, who reports snoring and excessive daytime somnolence as well as witnessed apneas.   Epworth sleepiness score: 10/24.  BMI: 29.7 kg/m  FINDINGS:   Total Record Time (hours, min): 10 H 10 min  Total Sleep Time (hours, min):  9 H 35 min   Percent REM (%):    32.55 %   Calculated pAHI (per hour):  20       REM pAHI:    37.8     NREM pAHI: 11.3   Oxygen Saturation (%) Mean: 79  Minimum oxygen saturation (%):         79   O2 Saturation Range (%): 98-79  O2Saturation (minutes) <=88%:  1.3   Pulse Mean (bpm):    65  Pulse Range (105-65)   IMPRESSION: OSA (obstructive sleep apnea), moderate   RECOMMENDATION:  This home sleep test demonstrates moderate obstructive sleep apnea with a total AHI of 20/hour and O2 nadir of 79%. Treatment with positive airway pressure is recommended. The patient will be advised to proceed with an autoPAP titration/trial at home for now. A full night titration study may be considered to optimize treatment settings, if needed down the road. Other treatment options may include dental and surgical options. Weight loss is recommended. Please note that untreated obstructive sleep apnea may carry additional perioperative morbidity. Patients with significant obstructive sleep apnea should receive perioperative PAP therapy and the surgeons and particularly the anesthesiologist should be informed of the diagnosis and the severity of the sleep disordered breathing. The patient should be cautioned not to drive, work at heights, or operate dangerous or heavy equipment when tired or sleepy. Review and reiteration of good  sleep hygiene measures should be pursued with any patient. Other causes of the patient's symptoms, including circadian rhythm disturbances, an underlying mood disorder, medication effect and/or an underlying medical problem cannot be ruled out based on this test. Clinical correlation is recommended. The patient and her referring provider will be notified of the test results. The patient will be seen in follow up in sleep clinic at [facility].  I certify that I have reviewed the raw data recording prior to the issuance of this report in accordance with the standards of the American Academy of Sleep Medicine (AASM).    INTERPRETING PHYSICIAN:    Star Age, MD, PhD  Board Certified in Neurology and Sleep Medicine  [facility] [address] [place], [place] [address] [phone]

## 2020-03-26 NOTE — Addendum Note (Signed)
Addended by: Star Age on: 03/26/2020 10:08 PM   Modules accepted: Orders

## 2020-03-26 NOTE — Progress Notes (Signed)
Patient referred by Dr. Pamella Pert, seen by me on 03/05/20, HST on 03/20/20.    Please call and notify the patient that the recent home sleep test showed obstructive sleep apnea in the moderate range. I recommend treatment in the form of autoPAP, which means, that we don't have to bring her in for a sleep study with CPAP, but will let her try an autoPAP machine at home, through a DME company (of her choice, or as per insurance requirement). The DME representative will educate her on how to use the machine, how to put the mask on, etc. I have placed an order in the chart. Please send referral, talk to patient, send report to referring MD. We will need a FU in sleep clinic for 10 weeks post-PAP set up, please arrange that with me or one of our NPs. Thanks,   Star Age, MD, PhD [facility])

## 2020-03-27 ENCOUNTER — Telehealth: Payer: Self-pay

## 2020-03-27 NOTE — Telephone Encounter (Signed)
-----   Message from Star Age, MD sent at 03/26/2020 10:08 PM EST ----- Patient referred by Dr. Pamella Pert, seen by me on 03/05/20, HST on 03/20/20.    Please call and notify the patient that the recent home sleep test showed obstructive sleep apnea in the moderate range. I recommend treatment in the form of autoPAP, which means, that we don't have to bring her in for a sleep study with CPAP, but will let her try an autoPAP machine at home, through a DME company (of her choice, or as per insurance requirement). The DME representative will educate her on how to use the machine, how to put the mask on, etc. I have placed an order in the chart. Please send referral, talk to patient, send report to referring MD. We will need a FU in sleep clinic for 10 weeks post-PAP set up, please arrange that with me or one of our NPs. Thanks,   Star Age, MD, PhD [facility])

## 2020-03-27 NOTE — Telephone Encounter (Signed)
I called pt. I advised pt that Dr. Rexene Alberts reviewed their sleep study results and found that pt has moderate osa. Dr. Rexene Alberts recommends that pt start an auto pap at home. I reviewed PAP compliance expectations with the pt. Pt is agreeable to starting an auto-PAP. I advised pt that an order will be sent to a DME, [employer], and Huey Romans will call the pt within about one week after they file with the pt's insurance. Huey Romans will show the pt how to use the machine, fit for masks, and troubleshoot the auto-PAP if needed. A follow up appt was made for insurance purposes with Dr. Rexene Alberts on 07/15/2020 at 7:30am. Pt verbalized understanding to arrive 15 minutes early and bring their auto-PAP. A letter with all of this information in it will be mailed to the pt as a reminder. I verified with the pt that the address we have on file is correct. Pt verbalized understanding of results. Pt had no questions at this time but was encouraged to call back if questions arise. I have sent the order to [facility] and have received confirmation that they have received the order.

## 2020-07-15 ENCOUNTER — Ambulatory Visit: Payer: Self-pay | Admitting: Neurology

## 2020-07-15 ENCOUNTER — Ambulatory Visit: Payer: Medicare Other | Admitting: Neurology

## 2020-08-01 ENCOUNTER — Encounter: Payer: Medicare Other | Admitting: Family Medicine

## 2020-09-26 DIAGNOSIS — E785 Hyperlipidemia, unspecified: Secondary | ICD-10-CM | POA: Insufficient documentation

## 2020-09-26 DIAGNOSIS — E1140 Type 2 diabetes mellitus with diabetic neuropathy, unspecified: Secondary | ICD-10-CM | POA: Insufficient documentation

## 2020-09-26 DIAGNOSIS — E1122 Type 2 diabetes mellitus with diabetic chronic kidney disease: Secondary | ICD-10-CM | POA: Insufficient documentation

## 2020-09-26 DIAGNOSIS — L659 Nonscarring hair loss, unspecified: Secondary | ICD-10-CM | POA: Insufficient documentation

## 2020-09-26 DIAGNOSIS — K219 Gastro-esophageal reflux disease without esophagitis: Secondary | ICD-10-CM | POA: Insufficient documentation

## 2020-09-26 DIAGNOSIS — G44009 Cluster headache syndrome, unspecified, not intractable: Secondary | ICD-10-CM | POA: Insufficient documentation

## 2020-09-27 DIAGNOSIS — J302 Other seasonal allergic rhinitis: Secondary | ICD-10-CM | POA: Insufficient documentation

## 2020-11-04 ENCOUNTER — Ambulatory Visit: Payer: Medicare (Managed Care) | Admitting: Neurology

## 2020-11-04 ENCOUNTER — Encounter: Payer: Self-pay | Admitting: Neurology

## 2020-11-04 VITALS — BP 139/82 | HR 67 | Ht 62.5 in | Wt 166.0 lb

## 2020-11-04 DIAGNOSIS — Z9989 Dependence on other enabling machines and devices: Secondary | ICD-10-CM | POA: Diagnosis not present

## 2020-11-04 DIAGNOSIS — G4733 Obstructive sleep apnea (adult) (pediatric): Secondary | ICD-10-CM | POA: Diagnosis not present

## 2020-11-04 NOTE — Patient Instructions (Signed)
It was nice to see you again! You are doing well with your autoPAP usage thus far.  As you know, it is an adjustment. I would like for you to try the smaller nasal insert.  You may also want to try the chinstrap which may help reduce your air leakage from the mouth.   Please continue using your autoPAP regularly. While your insurance requires that you use PAP at least 4 hours each night on 70% of the nights, I recommend, that you not skip any nights and use it throughout the night if you can. Getting used to PAP and staying with the treatment long term does take time and patience and discipline. Untreated obstructive sleep apnea when it is moderate to severe can have an adverse impact on cardiovascular health and raise her risk for heart disease, arrhythmias, hypertension, congestive heart failure, stroke and diabetes. Untreated obstructive sleep apnea causes sleep disruption, nonrestorative sleep, and sleep deprivation. This can have an impact on your day to day functioning and cause daytime sleepiness and impairment of cognitive function, memory loss, mood disturbance, and problems focussing. Using PAP regularly can improve these symptoms.  Please follow-up routinely to see one of our nurse practitioners in 3 to 4 months.  I would like to review your compliance download in about a month from now, please email [place] through [place] or call us to remind [place] to review your data.
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
## 2021-01-27 DIAGNOSIS — L219 Seborrheic dermatitis, unspecified: Secondary | ICD-10-CM | POA: Insufficient documentation
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
## 2021-03-10 ENCOUNTER — Encounter: Payer: Self-pay | Admitting: Family Medicine

## 2021-03-10 ENCOUNTER — Ambulatory Visit: Payer: Medicare (Managed Care) | Admitting: Family Medicine

## 2021-03-10 DIAGNOSIS — G4733 Obstructive sleep apnea (adult) (pediatric): Secondary | ICD-10-CM
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
## 2021-04-16 NOTE — Patient Instructions (Addendum)
Please continue using your CPAP regularly. While your insurance requires that you use CPAP at least 4 hours each night on 70% of the nights, I recommend, that you not skip any nights and use it throughout the night if you can. Getting used to CPAP and staying with the treatment long term does take time and patience and discipline. Untreated obstructive sleep apnea when it is moderate to severe can have an adverse impact on cardiovascular health and raise her risk for heart disease, arrhythmias, hypertension, congestive heart failure, stroke and diabetes. Untreated obstructive sleep apnea causes sleep disruption, nonrestorative sleep, and sleep deprivation. This can have an impact on your day to day functioning and cause daytime sleepiness and impairment of cognitive function, memory loss, mood disturbance, and problems focussing. Using CPAP regularly can improve these symptoms.   Follow up in 6 months   DME: Margorie John: [phone]

## 2021-04-17 ENCOUNTER — Encounter: Payer: Self-pay | Admitting: Family Medicine

## 2021-04-17 ENCOUNTER — Ambulatory Visit: Payer: Medicare (Managed Care) | Admitting: Family Medicine

## 2021-04-17 VITALS — BP 138/82 | HR 101 | Ht 62.5 in | Wt 169.5 lb

## 2021-04-17 DIAGNOSIS — Z9989 Dependence on other enabling machines and devices: Secondary | ICD-10-CM

## 2021-04-17 DIAGNOSIS — G4733 Obstructive sleep apnea (adult) (pediatric): Secondary | ICD-10-CM | POA: Diagnosis not present

## 2021-06-13 DIAGNOSIS — M7540 Impingement syndrome of unspecified shoulder: Secondary | ICD-10-CM | POA: Insufficient documentation

## 2021-06-13 DIAGNOSIS — M25511 Pain in right shoulder: Secondary | ICD-10-CM | POA: Insufficient documentation

## 2021-06-15 DIAGNOSIS — M25559 Pain in unspecified hip: Secondary | ICD-10-CM | POA: Insufficient documentation

## 2021-08-21 DIAGNOSIS — L719 Rosacea, unspecified: Secondary | ICD-10-CM | POA: Insufficient documentation

## 2021-08-21 DIAGNOSIS — Z789 Other specified health status: Secondary | ICD-10-CM | POA: Insufficient documentation

## 2021-10-21 ENCOUNTER — Telehealth: Payer: Medicare (Managed Care) | Admitting: Family Medicine

## 2021-10-28 ENCOUNTER — Telehealth: Payer: Medicare (Managed Care) | Admitting: Family Medicine

## 2021-11-20 DIAGNOSIS — R5383 Other fatigue: Secondary | ICD-10-CM | POA: Insufficient documentation

## 2021-12-01 NOTE — Progress Notes (Unsigned)
PATIENT: Jane Pena DOB: 1955/07/25  REASON FOR VISIT: follow up HISTORY FROM: patient  Virtual Visit via Telephone Note  I connected with Jane Pena on 12/02/21 at  7:30 AM EDT by telephone and verified that I am speaking with the correct person using two identifiers.   I discussed the limitations, risks, security and privacy concerns of performing an evaluation and management service by telephone and the availability of in person appointments. I also discussed with the patient that there may be a patient responsible charge related to this service. The patient expressed understanding and agreed to proceed.   History of Present Illness:  12/02/21 ALL ([place]): Jane Pena is a 66 y.o. female here today for follow up for OSA on CPAP. She is doing fairly well on CPAP. She admits that she has not been as consistent with usage. She reports not having supplies for a period of time. She admits that she falls asleep in her recliner and does not start therapy some nights. She denies concerns with machine or supplies. She recently received a nasal pillow mask and feels it is more comfortable.     04/17/21 ALL:  Jane Pena is a 66 y.o. female here today for follow up for OSA on CPAP.  She was last seen by Dr Rexene Alberts and having difficulty meeting compliance goals. Previously 60% compliant with 4 our usage. She reports doing "better". She is getting therapy started earlier in the night and using a little a longer. She admits that mask sometimes comes off in the middle of the night. She does feel that she is resting better. She states that her friend tells her that she is not snoring as much.     Observations/Objective:  Generalized: Well developed, in no acute distress  Mentation: Alert oriented to time, place, history taking. Follows all commands speech and language fluent   Assessment and Plan:  66 y.o. year old female  has a past medical history of Breast calcification,  right, Diabetes mellitus without complication ([place]), GERD (gastroesophageal reflux disease), Headache disorder, Hyperlipidemia, and Hypertension. here with    ICD-10-CM   1. OSA on CPAP  G47.33 For home use only DME continuous positive airway pressure (CPAP)   Z99.89       Jane Pena reports doing well on CPAP but admits compliance has not been optimal. Compliance report shows 47% daily and 43% 4 hour usage. I have encouraged her to use CPAP nightly for at least 4 hours. Risks of untreated sleep apnea discussed. I will repeat download in 4 weeks to assess compliance.   Orders Placed This Encounter  Procedures   For home use only DME continuous positive airway pressure (CPAP)    Supplies    Order Specific Question:   Length of Need    Answer:   Lifetime    Order Specific Question:   Patient has OSA or probable OSA    Answer:   Yes    Order Specific Question:   Is the patient currently using CPAP in the home    Answer:   Yes    Order Specific Question:   Settings    Answer:   Other see comments    Order Specific Question:   CPAP supplies needed    Answer:   Mask, headgear, cushions, filters, heated tubing and water chamber    No orders of the defined types were placed in this encounter.    Follow Up Instructions:  I discussed the assessment  and treatment plan with the patient. The patient was provided an opportunity to ask questions and all were answered. The patient agreed with the plan and demonstrated an understanding of the instructions.   The patient was advised to call back or seek an in-person evaluation if the symptoms worsen or if the condition fails to improve as anticipated.  I provided 15 minutes of non-face-to-face time during this encounter. Patient located at their place of residence during [place] visit. Provider is in the office.    Debbora Presto, NP

## 2021-12-01 NOTE — Patient Instructions (Incomplete)
Please continue using your CPAP regularly. While your insurance requires that you use CPAP at least 4 hours each night on 70% of the nights, I recommend, that you not skip any nights and use it throughout the night if you can. Getting used to CPAP and staying with the treatment long term does take time and patience and discipline. Untreated obstructive sleep apnea when it is moderate to severe can have an adverse impact on cardiovascular health and raise her risk for heart disease, arrhythmias, hypertension, congestive heart failure, stroke and diabetes. Untreated obstructive sleep apnea causes sleep disruption, nonrestorative sleep, and sleep deprivation. This can have an impact on your day to day functioning and cause daytime sleepiness and impairment of cognitive function, memory loss, mood disturbance, and problems focussing. Using CPAP regularly can improve these symptoms.   Please work on improving compliance. I will reprint download in 4-6 weeks to assess compliance.

## 2021-12-02 ENCOUNTER — Telehealth (INDEPENDENT_AMBULATORY_CARE_PROVIDER_SITE_OTHER): Payer: Medicare (Managed Care) | Admitting: Family Medicine

## 2021-12-02 ENCOUNTER — Encounter: Payer: Self-pay | Admitting: Family Medicine

## 2021-12-02 DIAGNOSIS — Z9989 Dependence on other enabling machines and devices: Secondary | ICD-10-CM

## 2021-12-02 DIAGNOSIS — G4733 Obstructive sleep apnea (adult) (pediatric): Secondary | ICD-10-CM | POA: Diagnosis not present

## 2021-12-02 NOTE — Progress Notes (Signed)
New order faxed to DME: [facility]   [phone]  ? ?Fax confirmation received  ?

## 2022-02-19 ENCOUNTER — Encounter: Payer: Self-pay | Admitting: Family Medicine

## 2022-03-23 DIAGNOSIS — K521 Toxic gastroenteritis and colitis: Secondary | ICD-10-CM | POA: Insufficient documentation

## 2022-12-28 ENCOUNTER — Ambulatory Visit: Payer: Medicare (Managed Care) | Admitting: Dermatology

## 2022-12-28 ENCOUNTER — Other Ambulatory Visit: Payer: Self-pay | Admitting: Dermatology

## 2022-12-28 ENCOUNTER — Encounter: Payer: Self-pay | Admitting: Dermatology

## 2022-12-28 VITALS — BP 161/101 | HR 73

## 2022-12-28 DIAGNOSIS — L719 Rosacea, unspecified: Secondary | ICD-10-CM | POA: Diagnosis not present

## 2022-12-28 DIAGNOSIS — G4733 Obstructive sleep apnea (adult) (pediatric): Secondary | ICD-10-CM | POA: Insufficient documentation

## 2022-12-28 MED ORDER — PIMECROLIMUS 1 % EX CREA: TOPICAL_CREAM | Freq: Two times a day (BID) | CUTANEOUS | 4 refills | Status: DC

## 2022-12-28 MED ORDER — DOXYCYCLINE HYCLATE 100 MG PO TABS
100.0000 mg | ORAL_TABLET | Freq: Every day | ORAL | 0 refills | Status: AC
Start: 2022-12-28 — End: 2023-01-27

## 2022-12-28 NOTE — Patient Instructions (Addendum)
Hello Jane Pena,  Thank you for visiting [place] today and discussing your concerns regarding your skin condition. We appreciate your commitment to improving your health and are here to support you in managing your symptoms of Rosacea effectively.  Based on our conversation, here are the key instructions and recommendations for your treatment plan:  - Medications Prescribed:   - Pimecrolimus Cream: Apply topically as directed to calm inflammation. Safe for daily use.   - Doxycycline: Take a low dose once daily with food to help clear the papules and reduce inflammation. This is an antibiotic with anti-inflammatory properties.  - Lifestyle Adjustments:   - Sunscreen Use: Essential for protecting your skin. We have provided samples to help you find one that suits your skin type.  - Follow-Up Care:   - Next Appointment: Scheduled for three months from now to assess progress. If there is no improvement, we will consider alternative treatments.   - MyChart Communication: For any issues with your prescriptions or other concerns, please use MyChart for a faster response.   We aim for noticeable improvement within the next few months and will adjust the treatment plan as necessary based on your progress. Please follow the prescribed regimen and do not hesitate to reach out if you have any questions or concerns.  Warm regards,  Dr. Langston Reusing, Dermatology   Due to recent changes in healthcare laws, you may see results of your pathology and/or laboratory studies on MyChart before the doctors have had a chance to review them. We understand that in some cases there may be results that are confusing or concerning to you. Please understand that not all results are received at the same time and often the doctors may need to interpret multiple results in order to provide you with the best plan of care or course of treatment. Therefore, we ask that you please give [place] 2 business days to thoroughly review all  your results before contacting the office for clarification. Should we see a critical lab result, you will be contacted sooner.   If You Need Anything After Your Visit  If you have any questions or concerns for your doctor, please call our main line at [phone] If no one answers, please leave a voicemail as directed and we will return your call as soon as possible. Messages left after 4 pm will be answered the following business day.   You may also send [place] a message via MyChart. We typically respond to MyChart messages within 1-2 business days.  For prescription refills, please ask your pharmacy to contact our office. Our fax number is [phone].  If you have an urgent issue when the clinic is closed that cannot wait until the next business day, you can page your doctor at the number below.    Please note that while we do our best to be available for urgent issues outside of office hours, we are not available 24/7.   If you have an urgent issue and are unable to reach [place], you may choose to seek medical care at your doctor's office, retail clinic, urgent care center, or emergency room.  If you have a medical emergency, please immediately call 911 or go to the emergency department. In the event of inclement weather, please call our main line at [phone] for an update on the status of any delays or closures.  Dermatology Medication Tips: Please keep the boxes that topical medications come in in order to help keep track of the instructions about  where and how to use these. Pharmacies typically print the medication instructions only on the boxes and not directly on the medication tubes.   If your medication is too expensive, please contact our office at [phone] or send [place] a message through MyChart.   We are unable to tell what your co-pay for medications will be in advance as this is different depending on your insurance coverage. However, we may be able to find a substitute  medication at lower cost or fill out paperwork to get insurance to cover a needed medication.   If a prior authorization is required to get your medication covered by your insurance company, please allow [place] 1-2 business days to complete this process.  Drug prices often vary depending on where the prescription is filled and some pharmacies may offer cheaper prices.  The website www.goodrx.com contains coupons for medications through different pharmacies. The prices here do not account for what the cost may be with help from insurance (it may be cheaper with your insurance), but the website can give you the price if you did not use any insurance.  - You can print the associated coupon and take it with your prescription to the pharmacy.  - You may also stop by our office during regular business hours and pick up a GoodRx coupon card.  - If you need your prescription sent electronically to a different pharmacy, notify our office through [facility] or by phone at [phone]

## 2022-12-28 NOTE — Progress Notes (Unsigned)
   New Patient Visit   Subjective  Jane Pena is a 67 y.o. female who presents for the following: Facial and scalp breakouts  Patient states she has irration/rash located at the scalp and face that she would like to have examined. Patient reports the areas have been there for  2  year(s). she reports the areas are bothersome. She reports the areas are very itchy and bumps. She states that the areas have spread. Patient reports has previously been treated for these areas.She was prescribed hydrocortisone 2.5% cream years ago, it helped with the itching but it didn't completely resolve the bumps and redness. She denies changing facial products, detergents and/or body washes. She is unsure of potential triggers. Patient denies Hx of bx. Patient denies family history of skin cancer(s).   The following portions of the chart were reviewed this encounter and updated as appropriate: medications, allergies, medical history  Review of Systems:  No other skin or systemic complaints except as noted in HPI or Assessment and Plan.  Objective  Well appearing patient in no apparent distress; mood and affect are within normal limits.  A focused examination was performed of the following areas: Scalp and face  Relevant exam findings are noted in the Assessment and Plan.         Assessment & Plan   ROSACEA Exam Nose cheeks, Mid face erythematous with telangiectasias scattered inflammatory papules  Flared  Rosacea is a chronic progressive skin condition usually affecting the face of adults, causing redness and/or acne bumps. It is treatable but not curable. It sometimes affects the eyes (ocular rosacea) as well. It may respond to topical and/or systemic medication and can flare with stress, sun exposure, alcohol, exercise, topical steroids (including hydrocortisone/cortisone 10) and some foods.  Daily application of broad spectrum spf 30+ sunscreen to face is recommended to reduce flares.  Patient  denies grittiness of the eyes  Treatment Plan: - Discussed potential contributing factors - We will prescribe Pimecrolimus ointments, apply topically 2 times daily - We will also prescribe Doxycycline, 100 mg once daily for 3 months then we reassess  Return in about 3 months (around 03/30/2023) for Rosacea F/U.  Documentation: I have reviewed the above documentation for accuracy and completeness, and I agree with the above.  Stasia Cavalier, am acting as scribe for Langston Reusing, DO.  Langston Reusing, DO

## 2022-12-30 ENCOUNTER — Other Ambulatory Visit: Payer: Self-pay

## 2022-12-30 MED ORDER — TACROLIMUS 0.1 % EX OINT: TOPICAL_OINTMENT | Freq: Two times a day (BID) | CUTANEOUS | 0 refills | Status: DC

## 2023-01-01 ENCOUNTER — Encounter: Payer: Self-pay | Admitting: Dermatology

## 2023-01-04 ENCOUNTER — Other Ambulatory Visit: Payer: Self-pay

## 2023-01-04 DIAGNOSIS — L719 Rosacea, unspecified: Secondary | ICD-10-CM

## 2023-01-04 MED ORDER — PIMECROLIMUS 1 % EX CREA
TOPICAL_CREAM | Freq: Two times a day (BID) | CUTANEOUS | 4 refills | Status: DC
Start: 2023-01-04 — End: 2023-01-04

## 2023-01-04 MED ORDER — PIMECROLIMUS 1 % EX CREA
TOPICAL_CREAM | Freq: Two times a day (BID) | CUTANEOUS | 4 refills | Status: DC
Start: 2023-01-04 — End: 2023-01-07

## 2023-01-07 ENCOUNTER — Other Ambulatory Visit: Payer: Self-pay

## 2023-01-07 DIAGNOSIS — L719 Rosacea, unspecified: Secondary | ICD-10-CM

## 2023-01-07 MED ORDER — CALCIPOTRIENE 0.005 % EX CREA
TOPICAL_CREAM | Freq: Two times a day (BID) | CUTANEOUS | 4 refills | Status: DC
Start: 2023-01-07 — End: 2023-10-04

## 2023-01-28 ENCOUNTER — Other Ambulatory Visit: Payer: Self-pay

## 2023-01-28 DIAGNOSIS — L719 Rosacea, unspecified: Secondary | ICD-10-CM

## 2023-01-28 MED ORDER — DOXYCYCLINE HYCLATE 100 MG PO TABS
100.0000 mg | ORAL_TABLET | Freq: Every day | ORAL | 3 refills | Status: AC
Start: 2023-01-28 — End: 2023-05-28

## 2023-03-24 LAB — HM MAMMOGRAPHY

## 2023-03-30 ENCOUNTER — Encounter: Payer: Self-pay | Admitting: Dermatology

## 2023-03-30 ENCOUNTER — Ambulatory Visit: Payer: Medicare (Managed Care) | Admitting: Dermatology

## 2023-03-30 VITALS — BP 158/102 | HR 66

## 2023-03-30 DIAGNOSIS — L719 Rosacea, unspecified: Secondary | ICD-10-CM

## 2023-03-30 MED ORDER — METRONIDAZOLE 0.75 % EX CREA
TOPICAL_CREAM | Freq: Two times a day (BID) | CUTANEOUS | 5 refills | Status: DC
Start: 2023-03-30 — End: 2023-10-04

## 2023-03-30 NOTE — Patient Instructions (Addendum)
Hello Annai,  Thank you for visiting [place] today. We appreciate your commitment to improving your health and managing your rosacea. Here is a summary of the key instructions from today's consultation:  - Medications Prescribed:   - Doxycycline: Continue using as previously prescribed. Ensure you have a refill available for any sudden flare-ups.   - Metrocream: Prescribed for topical application. Apply morning and night if covered by your insurance.   - Azelaic Acid Cream: If Metrocream is not covered, purchase from [facility] for about $12. Use as an alternative to Metrocream.  - Lifestyle Adjustments:   - Sunscreen: Consistent use is crucial, especially if outdoors for more than 10 minutes, to prevent flare-ups and protect your skin.  - Follow-Up Care:   - Visits: Plan to have follow-up visits every six months to monitor the condition and adjust the treatment plan as necessary.  - Additional Recommendations:   - Discussed various face washes, moisturizers, and lotions to keep your skin healthy, especially through the winter months.  We have included a picture of the Azelaic Acid Cream in your after-visit summary for easy identification. Please feel free to reach out if you have any questions or concerns before our next scheduled appointment.  Wishing you the best of health,  Dr. Langston Reusing,  Dermatology    Important Information   Due to recent changes in healthcare laws, you may see results of your pathology and/or laboratory studies on MyChart before the doctors have had a chance to review them. We understand that in some cases there may be results that are confusing or concerning to you. Please understand that not all results are received at the same time and often the doctors may need to interpret multiple results in order to provide you with the best plan of care or course of treatment. Therefore, we ask that you please give [place] 2 business days to thoroughly review all your results  before contacting the office for clarification. Should we see a critical lab result, you will be contacted sooner.     If You Need Anything After Your Visit   If you have any questions or concerns for your doctor, please call our main line at [phone]. If no one answers, please leave a voicemail as directed and we will return your call as soon as possible. Messages left after 4 pm will be answered the following business day.    You may also send [place] a message via MyChart. We typically respond to MyChart messages within 1-2 business days.  For prescription refills, please ask your pharmacy to contact our office. Our fax number is [phone].  If you have an urgent issue when the clinic is closed that cannot wait until the next business day, you can page your doctor at the number below.     Please note that while we do our best to be available for urgent issues outside of office hours, we are not available 24/7.    If you have an urgent issue and are unable to reach [place], you may choose to seek medical care at your doctor's office, retail clinic, urgent care center, or emergency room.   If you have a medical emergency, please immediately call 911 or go to the emergency department. In the event of inclement weather, please call our main line at [phone] for an update on the status of any delays or closures.  Dermatology Medication Tips: Please keep the boxes that topical medications come in in order to help keep track  of the instructions about where and how to use these. Pharmacies typically print the medication instructions only on the boxes and not directly on the medication tubes.   If your medication is too expensive, please contact our office at [phone] or send [place] a message through MyChart.    We are unable to tell what your co-pay for medications will be in advance as this is different depending on your insurance coverage. However, we may be able to find a substitute  medication at lower cost or fill out paperwork to get insurance to cover a needed medication.    If a prior authorization is required to get your medication covered by your insurance company, please allow [place] 1-2 business days to complete this process.   Drug prices often vary depending on where the prescription is filled and some pharmacies may offer cheaper prices.   The website www.goodrx.com contains coupons for medications through different pharmacies. The prices here do not account for what the cost may be with help from insurance (it may be cheaper with your insurance), but the website can give you the price if you did not use any insurance.  - You can print the associated coupon and take it with your prescription to the pharmacy.  - You may also stop by our office during regular business hours and pick up a GoodRx coupon card.  - If you need your prescription sent electronically to a different pharmacy, notify our office through [facility] or by phone at [phone]

## 2023-03-30 NOTE — Progress Notes (Signed)
   Follow-Up Visit   Subjective  Jane Pena is a 67 y.o. female who presents for the following: Rosacea  Patient present today for follow up visit for Rosacea. Patient was last evaluated on 12/28/22. At her previous visit she was prescribed Pimecrolimus ointments to apply topically 2 times daily & we prescribed Doxycycline, 100 mg once daily for 3 months. Patient reports her insurance did not cover the Pimecrolimus so she only took the doxycycline. Patient reports sxs are better. Patient denies medication changes.  The following portions of the chart were reviewed this encounter and updated as appropriate: medications, allergies, medical history  Review of Systems:  No other skin or systemic complaints except as noted in HPI or Assessment and Plan.  Objective  Well appearing patient in no apparent distress; mood and affect are within normal limits.  A focused examination was performed of the following areas: Face  Relevant exam findings are noted in the Assessment and Plan.         Assessment & Plan   ROSACEA (Improved after course of Doxy) Exam: Resolved Mid face erythema with telangiectasias +/-   Well Controlled   Rosacea is a chronic progressive skin condition usually affecting the face of adults, causing redness and/or acne bumps. It is treatable but not curable. It sometimes affects the eyes (ocular rosacea) as well. It may respond to topical and/or systemic medication and can flare with stress, sun exposure, alcohol, exercise, topical steroids (including hydrocortisone/cortisone 10) and some foods.  Daily application of broad spectrum spf 30+ sunscreen to face is recommended to reduce flares.  Patient denies grittiness of the eyes  Treatment Plan: - We will prescribe Metronidazole cream to apply to the face 2 times daily  - If the metronidazole cream isn't covered by her insurance, recommended purchasing The Ordinary Azelaic Acid to apply to the face 2 times daily - We  will plan to follow up in 6 months to reassess   Rosacea  Related Medications calcipotriene (DOVONOX) 0.005 % cream Apply topically 2 (two) times daily.  doxycycline (VIBRA-TABS) 100 MG tablet Take 1 tablet (100 mg total) by mouth daily.   Return in about 6 months (around 09/27/2023) for Rosacea F/U.    Documentation: I have reviewed the above documentation for accuracy and completeness, and I agree with the above.  Stasia Cavalier, am acting as scribe for Langston Reusing, DO.  Langston Reusing, DO

## 2023-09-27 ENCOUNTER — Ambulatory Visit: Payer: Medicare (Managed Care) | Admitting: Dermatology

## 2023-09-27 ENCOUNTER — Encounter (HOSPITAL_COMMUNITY): Payer: Self-pay

## 2023-10-04 ENCOUNTER — Encounter: Payer: Self-pay | Admitting: Adult Health

## 2023-10-04 ENCOUNTER — Ambulatory Visit (INDEPENDENT_AMBULATORY_CARE_PROVIDER_SITE_OTHER): Payer: Medicare (Managed Care) | Admitting: Adult Health

## 2023-10-04 VITALS — BP 124/87 | HR 73 | Temp 98.1°F | Resp 18 | Ht 63.0 in | Wt 172.2 lb

## 2023-10-04 DIAGNOSIS — E785 Hyperlipidemia, unspecified: Secondary | ICD-10-CM

## 2023-10-04 DIAGNOSIS — I10 Essential (primary) hypertension: Secondary | ICD-10-CM

## 2023-10-04 DIAGNOSIS — G44029 Chronic cluster headache, not intractable: Secondary | ICD-10-CM

## 2023-10-04 DIAGNOSIS — G629 Polyneuropathy, unspecified: Secondary | ICD-10-CM

## 2023-10-04 DIAGNOSIS — Z13820 Encounter for screening for osteoporosis: Secondary | ICD-10-CM

## 2023-10-04 DIAGNOSIS — E1122 Type 2 diabetes mellitus with diabetic chronic kidney disease: Secondary | ICD-10-CM | POA: Diagnosis not present

## 2023-10-04 DIAGNOSIS — N1831 Chronic kidney disease, stage 3a: Secondary | ICD-10-CM | POA: Diagnosis not present

## 2023-10-04 DIAGNOSIS — E559 Vitamin D deficiency, unspecified: Secondary | ICD-10-CM

## 2023-10-04 DIAGNOSIS — G4733 Obstructive sleep apnea (adult) (pediatric): Secondary | ICD-10-CM

## 2023-10-04 DIAGNOSIS — Z7689 Persons encountering health services in other specified circumstances: Secondary | ICD-10-CM

## 2023-10-04 LAB — CBC WITH DIFFERENTIAL/PLATELET
Absolute Lymphocytes: 1230 {cells}/uL (ref 850–3900)
Absolute Monocytes: 273 {cells}/uL (ref 200–950)
Basophils Absolute: 52 {cells}/uL (ref 0–200)
Basophils Relative: 0.9 %
Eosinophils Absolute: 122 {cells}/uL (ref 15–500)
Eosinophils Relative: 2.1 %
HCT: 50 % — ABNORMAL HIGH (ref 35.0–45.0)
Hemoglobin: 16.6 g/dL — ABNORMAL HIGH (ref 11.7–15.5)
MCH: 30.7 pg (ref 27.0–33.0)
MCHC: 33.2 g/dL (ref 32.0–36.0)
MCV: 92.4 fL (ref 80.0–100.0)
MPV: 10.9 fL (ref 7.5–12.5)
Monocytes Relative: 4.7 %
Neutro Abs: 4124 {cells}/uL (ref 1500–7800)
Neutrophils Relative %: 71.1 %
Platelets: 204 10*3/uL (ref 140–400)
RBC: 5.41 10*6/uL — ABNORMAL HIGH (ref 3.80–5.10)
RDW: 13.6 % (ref 11.0–15.0)
Total Lymphocyte: 21.2 %
WBC: 5.8 10*3/uL (ref 3.8–10.8)

## 2023-10-04 LAB — LIPID PANEL
Cholesterol: 192 mg/dL (ref <200)
HDL: 69 mg/dL (ref >=50)
LDL Cholesterol (Calc): 107 mg/dL — ABNORMAL HIGH
Non-HDL Cholesterol (Calc): 123 mg/dL (ref <130)
Total CHOL/HDL Ratio: 2.8 (calc) (ref <5.0)
Triglycerides: 69 mg/dL (ref <150)

## 2023-10-04 LAB — COMPLETE METABOLIC PANEL WITHOUT GFR
AG Ratio: 1.8 (calc) (ref 1.0–2.5)
ALT: 26 U/L (ref 6–29)
AST: 23 U/L (ref 10–35)
Albumin: 5.1 g/dL (ref 3.6–5.1)
Alkaline phosphatase (APISO): 95 U/L (ref 37–153)
BUN/Creatinine Ratio: 12 (calc) (ref 6–22)
BUN: 15 mg/dL (ref 7–25)
CO2: 26 mmol/L (ref 20–32)
Calcium: 10.4 mg/dL (ref 8.6–10.4)
Chloride: 105 mmol/L (ref 98–110)
Creat: 1.22 mg/dL — ABNORMAL HIGH (ref 0.60–1.00)
Globulin: 2.9 g/dL (ref 1.9–3.7)
Glucose, Bld: 146 mg/dL — ABNORMAL HIGH (ref 65–99)
Potassium: 4.3 mmol/L (ref 3.5–5.3)
Sodium: 142 mmol/L (ref 135–146)
Total Bilirubin: 0.5 mg/dL (ref 0.2–1.2)
Total Protein: 8 g/dL (ref 6.1–8.1)

## 2023-10-04 LAB — HEMOGLOBIN A1C
Hgb A1c MFr Bld: 7.2 % — ABNORMAL HIGH (ref <5.7)
Mean Plasma Glucose: 160 mg/dL
eAG (mmol/L): 8.9 mmol/L

## 2023-10-04 LAB — VITAMIN D 25 HYDROXY (VIT D DEFICIENCY, FRACTURES): Vit D, 25-Hydroxy: 27 ng/mL — ABNORMAL LOW (ref 30–100)

## 2023-10-04 LAB — VITAMIN B12: Vitamin B-12: 1020 pg/mL (ref 200–1100)
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
## 2023-10-05 ENCOUNTER — Ambulatory Visit: Payer: Self-pay | Admitting: Adult Health

## 2023-10-05 LAB — MICROALBUMIN / CREATININE URINE RATIO
Creatinine, Urine: 76 mg/dL (ref 20–275)
Microalb Creat Ratio: 88 mg/g{creat} — ABNORMAL HIGH (ref <30)
Microalb, Ur: 6.7 mg/dL

## 2023-10-05 NOTE — Progress Notes (Signed)
-   We can discuss this on next visit and might need to start on Trulicity 0/75 mg SQ weekly for diabetes management.

## 2023-10-05 NOTE — Progress Notes (Signed)
-     Urine microalbumin creatinine ratio elevated, continue olmesartan  continue protection -A1c 7.2, up from 6.3 (02/02/2020), continue Jardiance, check blood sugar daily log and bring to next appointment -   LDL 107, elevated continue Zetia and rosuvastatin -   Cholesterol and triglycerides normal -   Hemoglobin 16.6, elevated probably due to dehydration from fasting for labs -   Electrolytes and liver enzymes normal -   Vitamin D  27, insufficient, will need to start vitamin D  3 1000 units daily -   Vitamin B12 normal -   Creatinine 1.22, calculated GFR 48, ranging as chronic kidney disease stage IIIa, same as previous lab, stable

## 2023-11-22 LAB — BASIC METABOLIC PANEL WITH GFR
BUN: 21 (ref 4–21)
CO2: 23 — AB (ref 13–22)
Chloride: 105 (ref 99–108)
Creatinine: 1.2 — AB (ref 0.5–1.1)
Glucose: 76
Potassium: 4.5 meq/L (ref 3.5–5.1)
Sodium: 142 (ref 137–147)

## 2023-11-22 LAB — COMPREHENSIVE METABOLIC PANEL WITH GFR
Albumin: 5.1 — AB (ref 3.5–5.0)
Calcium: 10.1 (ref 8.7–10.7)
eGFR: 48

## 2023-11-23 ENCOUNTER — Encounter: Payer: Self-pay | Admitting: Adult Health

## 2023-12-29 ENCOUNTER — Encounter: Payer: Self-pay | Admitting: Nurse Practitioner

## 2023-12-29 ENCOUNTER — Ambulatory Visit: Payer: Medicare (Managed Care) | Admitting: Nurse Practitioner

## 2023-12-29 VITALS — BP 118/70 | HR 67 | Temp 98.5°F | Ht 63.0 in | Wt 173.0 lb

## 2023-12-29 DIAGNOSIS — E66811 Obesity, class 1: Secondary | ICD-10-CM

## 2023-12-29 DIAGNOSIS — N1831 Chronic kidney disease, stage 3a: Secondary | ICD-10-CM | POA: Diagnosis not present

## 2023-12-29 DIAGNOSIS — I129 Hypertensive chronic kidney disease with stage 1 through stage 4 chronic kidney disease, or unspecified chronic kidney disease: Secondary | ICD-10-CM | POA: Diagnosis not present

## 2023-12-29 DIAGNOSIS — E1122 Type 2 diabetes mellitus with diabetic chronic kidney disease: Secondary | ICD-10-CM | POA: Diagnosis not present

## 2023-12-29 DIAGNOSIS — G4733 Obstructive sleep apnea (adult) (pediatric): Secondary | ICD-10-CM

## 2023-12-29 DIAGNOSIS — Z7689 Persons encountering health services in other specified circumstances: Secondary | ICD-10-CM

## 2023-12-29 DIAGNOSIS — E6609 Other obesity due to excess calories: Secondary | ICD-10-CM

## 2023-12-29 DIAGNOSIS — Z2821 Immunization not carried out because of patient refusal: Secondary | ICD-10-CM

## 2023-12-29 DIAGNOSIS — Z6830 Body mass index (BMI) 30.0-30.9, adult: Secondary | ICD-10-CM

## 2023-12-29 DIAGNOSIS — E559 Vitamin D deficiency, unspecified: Secondary | ICD-10-CM

## 2023-12-29 DIAGNOSIS — E782 Mixed hyperlipidemia: Secondary | ICD-10-CM

## 2023-12-29 DIAGNOSIS — I10 Essential (primary) hypertension: Secondary | ICD-10-CM

## 2023-12-29 DIAGNOSIS — Z789 Other specified health status: Secondary | ICD-10-CM

## 2023-12-29 DIAGNOSIS — Z79899 Other long term (current) drug therapy: Secondary | ICD-10-CM
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
## 2023-12-30 LAB — LIPID PANEL
Chol/HDL Ratio: 3.8 ratio (ref 0.0–4.4)
Cholesterol, Total: 215 mg/dL — ABNORMAL HIGH (ref 100–199)
HDL: 57 mg/dL (ref >39)
LDL Chol Calc (NIH): 139 mg/dL — ABNORMAL HIGH (ref 0–99)
Triglycerides: 104 mg/dL (ref 0–149)
VLDL Cholesterol Cal: 19 mg/dL (ref 5–40)

## 2023-12-30 LAB — HEMOGLOBIN A1C
Est. average glucose Bld gHb Est-mCnc: 157 mg/dL
Hgb A1c MFr Bld: 7.1 % — ABNORMAL HIGH (ref 4.8–5.6)

## 2023-12-30 LAB — CBC
Hematocrit: 49.3 % — ABNORMAL HIGH (ref 34.0–46.6)
Hemoglobin: 16.1 g/dL — ABNORMAL HIGH (ref 11.1–15.9)
MCH: 30.1 pg (ref 26.6–33.0)
MCHC: 32.7 g/dL (ref 31.5–35.7)
MCV: 92 fL (ref 79–97)
Platelets: 213 x10E3/uL (ref 150–450)
RBC: 5.35 x10E6/uL — ABNORMAL HIGH (ref 3.77–5.28)
RDW: 12.5 % (ref 11.7–15.4)
WBC: 5.7 x10E3/uL (ref 3.4–10.8)

## 2023-12-31 ENCOUNTER — Telehealth: Payer: Self-pay | Admitting: Pharmacist

## 2023-12-31 DIAGNOSIS — E1122 Type 2 diabetes mellitus with diabetic chronic kidney disease: Secondary | ICD-10-CM

## 2023-12-31 NOTE — Progress Notes (Unsigned)
 12/31/2023  Patient ID: Jane Pena, female   DOB: May 17, 1956, 68 y.o.   MRN: [id number]    12/31/2023 Name: Jane Pena MRN: [id number] DOB: [birth year]  Chief Complaint  Patient presents with   Medication Adherence    Cholesterol Medications    {Visit Ubez:73349}   Subjective:  Care Team: Primary Care Provider: Georgina Speaks, FNP ; Next Scheduled Visit: *** {careteamprovider:27366}  Medication Access/Adherence  Current Pharmacy:  [id number] [place], [place] - [address] AT [facility] [address] [place] [address] Phone: [phone] Fax: [phone]  [facility] - [place], [place] - [address] [place] [address] Phone: [phone] Fax: [phone]  [facility] - [place], [place] - [address] [place] [address] Phone: [phone] Fax: [phone]   Patient reports affordability concerns with their medications: {YES/NO:21197} Patient reports access/transportation concerns to their pharmacy: {YES/NO:21197} Patient reports adherence concerns with their medications:  {YES/NO:21197} ***   {Pharmacy S/O Choices:26420}   Objective:  Lab Results  Component Value Date   HGBA1C 7.1 (H) 12/29/2023    Lab Results  Component Value Date   CREATININE 1.2 (A) 11/22/2023   BUN 21 11/22/2023   NA 142 11/22/2023   K 4.5 11/22/2023   CL 105 11/22/2023   CO2 23 (A) 11/22/2023    Lab Results  Component Value Date   CHOL 215 (H) 12/29/2023   HDL 57 12/29/2023   LDLCALC 139 (H) 12/29/2023   TRIG 104 12/29/2023   CHOLHDL 3.8 12/29/2023    Medications Reviewed Today     Reviewed by Jolee Cassius PARAS, RPH (Pharmacist) on 12/31/23 at 1521  Med List Status: <None>   Medication Order Taking? Sig Documenting Provider Last Dose Status Informant  amLODipine  (NORVASC ) 10 MG tablet [id number] Yes Take 10 mg by mouth daily. [provider]  Active     Discontinued 12/31/23 1502 (Cost of medication)            Med Note>> Jolee Cassius PARAS, [facility]   12/31/2023  3:02 PM Insurance would not pay for the combination pill patient started to ger seperatlely    Coenzyme Q10 (CO Q 10) 100 MG CAPS [id number] Yes Take 1 capsule by mouth daily. [provider]  Active   ezetimibe (ZETIA) 10 MG tablet [id number] Yes Take 10 mg by mouth daily. [provider]  Active   glucose blood test strip [id number] Yes Use as directed to test blood sugar at least once daily. Melonie Colonel, Mikel HERO, MD  Active   JARDIANCE 25 MG TABS tablet [id number] Yes Take 25 mg by mouth daily. [provider]  Active   Lancets JANETT [facility] PLUS [place]) MISC [id number] Yes Apply 1 each topically daily. [provider]  Active   olmesartan  (BENICAR ) 40 MG tablet [id number] Yes Take 40 mg by mouth daily. [provider]  Active   rosuvastatin (CRESTOR) 5 MG tablet [id number] Yes Take 5 mg by mouth daily. [provider]  Active     Discontinued 12/31/23 1457 (Dose change)   topiramate (TOPAMAX) 25 MG tablet [id number] Yes Take 75 mg by mouth at bedtime. [provider]  Active   vitamin B-12 (CYANOCOBALAMIN) 500 MCG tablet [id number] Yes Take 500 mcg by mouth daily. [provider]  Active               Assessment/Plan:   {  Pharmacy A/P Choices:26421}  Follow Up Plan: ***  ***

## 2024-01-02 ENCOUNTER — Ambulatory Visit: Payer: Self-pay | Admitting: Nurse Practitioner

## 2024-01-03 MED ORDER — ONETOUCH VERIO VI STRP
ORAL_STRIP | 12 refills | Status: AC
Start: 2024-01-03 — End: ?

## 2024-01-13 NOTE — Assessment & Plan Note (Signed)
 She is encouraged to strive for BMI less than 30 to decrease cardiac risk. Advised to aim for at least 150 minutes of exercise per week as tolerated

## 2024-01-13 NOTE — Assessment & Plan Note (Addendum)
 Hyperlipidemia with statin intolerance due to fatigue and energy loss. LDL target is <70. - Refer to pharmacist for alternative lipid-lowering therapy. - ASCVD risk score is 24.4%, I will make a referral to pharmacy to discuss options for medications that may not have as many side effects.

## 2024-01-13 NOTE — Assessment & Plan Note (Signed)
 Obstructive sleep apnea managed with CPAP, used regularly without recent issues.

## 2024-01-13 NOTE — Assessment & Plan Note (Signed)

## 2024-01-13 NOTE — Assessment & Plan Note (Signed)
 Will check vitamin D  level and supplement as needed.    Also encouraged to spend 15 minutes in the sun daily.

## 2024-01-13 NOTE — Assessment & Plan Note (Addendum)
 Type 2 diabetes mellitus with suboptimal control on Jardiance alone after metformin  discontinuation due to side effects and renal concerns. - Recheck A1c today. - Consult pharmacist for alternative diabetes medications. - stable under [facility] care. Creatinine 1.2 as of July 7.

## 2024-01-13 NOTE — Assessment & Plan Note (Addendum)
 Blood pressure is well controlled, continue current medications. EKG done with t abnormality, SR. HR 64

## 2024-02-09 ENCOUNTER — Telehealth: Payer: Self-pay | Admitting: Pharmacist

## 2024-02-09 DIAGNOSIS — E7800 Pure hypercholesterolemia, unspecified: Secondary | ICD-10-CM

## 2024-02-09 NOTE — Progress Notes (Signed)
   02/09/2024  Patient ID: [name], female   DOB: 16-Nov-1955, 68 y.o.   MRN: [id number]  Patient was called to follow up on cholesterol medications. HIPAA identifiers were obtained.  Dorthea Maina is a 68 year old female with multiple medical conditions including but not limited to:  type 2 diabetes, obstructive sleep apnea on CPAP, allergic rhinitis, hyperlipidemia, hypertension, and vitamin D  deficiency.   Diabetes:   Current medications:     Jardiance 25 mg -gets through Patient Assistance   Patient denies hypoglycemic s/sx including  dizziness, shakiness, sweating. Patient denies hyperglycemic symptoms including  polyuria, polydipsia, polyphagia, nocturia, neuropathy, blurred vision.  Lab Results  Component Value Date   HGBA1C 7.1 (H) 12/29/2023   HGBA1C 7.2 (H) 10/04/2023   HGBA1C 6.3 (H) 02/02/2020      Hyperlipidemia/ASCVD Risk Reduction  Lipid Panel     Component Value Date/Time   CHOL 215 (H) 12/29/2023 1252   TRIG 104 12/29/2023 1252   HDL 57 12/29/2023 1252   CHOLHDL 3.8 12/29/2023 1252   CHOLHDL 2.8 10/04/2023 1006   VLDL 22 07/23/2006 1039   LDLCALC 139 (H) 12/29/2023 1252   LDLCALC 107 (H) 10/04/2023 1006   LABVLDL 19 12/29/2023 1252      Current lipid lowering medications:  Ezetimibe 10 mg 1 tablet every other day alternating with Rosuvastatin 5 mg - (not interested in Repatha)--Patient reported the every other day dosing is going well. She said she still feels fatigued but is walking 2 miles daily with her dog.   She did not start the Co-Q-10 as recommended but said she will.  Next PCP appointment- 05/04/2024  Clinical ASCVD: No  The 10-year ASCVD risk score (Arnett DK, et al., 2019) is: 24.4%   Values used to calculate the score:     Age: 1 years     Clincally relevant sex: Female     Is Non-Hispanic African American: Yes     Diabetic: Yes     Tobacco smoker: No     Systolic Blood Pressure: 118 mmHg     Is BP treated: Yes     HDL  Cholesterol: 57 mg/dL     Total Cholesterol: 215 mg/dL    Plan: Follow up with Patient in [place] for patient assistance renewal.   Cassius DOROTHA Brought, PharmD, BCACP Clinical Pharmacist [phone]

## 2024-03-07 ENCOUNTER — Other Ambulatory Visit: Payer: Self-pay | Admitting: Nurse Practitioner

## 2024-03-07 DIAGNOSIS — N1831 Chronic kidney disease, stage 3a: Secondary | ICD-10-CM

## 2024-03-07 MED ORDER — ONETOUCH VERIO FLEX SYSTEM W/DEVICE KIT: PACK | 0 refills | Status: AC

## 2024-03-27 ENCOUNTER — Telehealth: Payer: Self-pay | Admitting: Pharmacist

## 2024-03-27 DIAGNOSIS — E1122 Type 2 diabetes mellitus with diabetic chronic kidney disease: Secondary | ICD-10-CM

## 2024-03-27 NOTE — Progress Notes (Unsigned)
   03/27/2024  Patient ID: Jane Pena Sar, female   DOB: Sep 29, 1955, 68 y.o.   MRN: [id number]  Called Patient to discuss medication assistance for Jardiance 2025.. sent info for [facility]

## 2024-03-28 ENCOUNTER — Telehealth: Payer: Self-pay

## 2024-03-28 NOTE — Telephone Encounter (Signed)
 PAP: Patient assistance application for Jardiance through [employer]) has been mailed to pt's home address on file. Provider portion of application will be faxed to provider's office.

## 2024-03-29 ENCOUNTER — Encounter: Payer: Self-pay | Admitting: Pharmacist

## 2024-03-29 NOTE — Progress Notes (Unsigned)
   03/29/2024  Patient ID: Jane Pena, female   DOB: Mar 27, 1956, 69 y.o.   MRN: [id number]  BI form Jardiance

## 2024-04-25 NOTE — Telephone Encounter (Signed)
 Received patient portion PAP application [employer] Jardiance 

## 2024-05-03 ENCOUNTER — Ambulatory Visit: Payer: Medicare (Managed Care)

## 2024-05-03 DIAGNOSIS — Z1211 Encounter for screening for malignant neoplasm of colon: Secondary | ICD-10-CM

## 2024-05-03 DIAGNOSIS — Z0000 Encounter for general adult medical examination without abnormal findings: Secondary | ICD-10-CM | POA: Diagnosis not present
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
## 2024-05-03 NOTE — Patient Instructions (Signed)
 Ms. Brackens,  Thank you for taking the time for your Medicare Wellness Visit. I appreciate your continued commitment to your health goals. Please review the care plan we discussed, and feel free to reach out if I can assist you further.  Please note that Annual Wellness Visits do not include a physical exam. Some assessments may be limited, especially if the visit was conducted virtually. If needed, we may recommend an in-person follow-up with your provider.  Ongoing Care Seeing your primary care provider every 3 to 6 months helps us  monitor your health and provide consistent, personalized care.   Referrals If a referral was made during today's visit and you haven't received any updates within two weeks, please contact the referred provider directly to check on the status.  Recommended Screenings:  Health Maintenance  Topic Date Due   Medicare Annual Wellness Visit  Never done   Zoster (Shingles) Vaccine (1 of 2) Never done   Osteoporosis screening with Bone Density Scan  Never done   Colon Cancer Screening  12/22/2018   COVID-19 Vaccine (3 - Pfizer risk series) 10/20/2019   Eye exam for diabetics  03/01/2021   Flu Shot  08/15/2024*   DTaP/Tdap/Td vaccine (2 - Td or Tdap) 12/28/2024*   Pneumococcal Vaccine for age over 53 (1 of 2 - PCV) 12/28/2024*   Hemoglobin A1C  06/30/2024   Yearly kidney health urinalysis for diabetes  10/03/2024   Complete foot exam   10/03/2024   Yearly kidney function blood test for diabetes  11/21/2024   Breast Cancer Screening  03/23/2025   Hepatitis C Screening  Completed   Meningitis B Vaccine  Aged Out  *Topic was postponed. The date shown is not the original due date.       05/03/2024    2:58 PM  Advanced Directives  Does Patient Have a Medical Advance Directive? Yes  Type of Estate Agent of [place];Living will  Does patient want to make changes to medical advance directive? No - Patient declined  Copy of Healthcare Power  of Attorney in Chart? No - copy requested    Vision: Annual vision screenings are recommended for early detection of glaucoma, cataracts, and diabetic retinopathy. These exams can also reveal signs of chronic conditions such as diabetes and high blood pressure.  Dental: Annual dental screenings help detect early signs of oral cancer, gum disease, and other conditions linked to overall health, including heart disease and diabetes.  Please see the attached documents for additional preventive care recommendations.

## 2024-05-04 ENCOUNTER — Ambulatory Visit: Payer: Medicare (Managed Care) | Admitting: Nurse Practitioner

## 2024-05-04 ENCOUNTER — Encounter: Payer: Self-pay | Admitting: Nurse Practitioner

## 2024-05-04 VITALS — BP 130/70 | HR 100 | Temp 98.8°F | Ht 63.0 in | Wt 168.0 lb

## 2024-05-04 DIAGNOSIS — Z0001 Encounter for general adult medical examination with abnormal findings: Secondary | ICD-10-CM | POA: Diagnosis not present

## 2024-05-04 DIAGNOSIS — Z79899 Other long term (current) drug therapy: Secondary | ICD-10-CM

## 2024-05-04 DIAGNOSIS — Z0000 Encounter for general adult medical examination without abnormal findings: Secondary | ICD-10-CM

## 2024-05-04 DIAGNOSIS — E782 Mixed hyperlipidemia: Secondary | ICD-10-CM | POA: Diagnosis not present

## 2024-05-04 DIAGNOSIS — I10 Essential (primary) hypertension: Secondary | ICD-10-CM | POA: Diagnosis not present

## 2024-05-04 DIAGNOSIS — L732 Hidradenitis suppurativa: Secondary | ICD-10-CM

## 2024-05-04 DIAGNOSIS — E663 Overweight: Secondary | ICD-10-CM

## 2024-05-04 DIAGNOSIS — G4733 Obstructive sleep apnea (adult) (pediatric): Secondary | ICD-10-CM | POA: Diagnosis not present

## 2024-05-04 DIAGNOSIS — K5904 Chronic idiopathic constipation: Secondary | ICD-10-CM | POA: Diagnosis not present

## 2024-05-04 DIAGNOSIS — Z6829 Body mass index (BMI) 29.0-29.9, adult: Secondary | ICD-10-CM

## 2024-05-04 DIAGNOSIS — G44029 Chronic cluster headache, not intractable: Secondary | ICD-10-CM

## 2024-05-04 DIAGNOSIS — N1831 Chronic kidney disease, stage 3a: Secondary | ICD-10-CM | POA: Diagnosis not present

## 2024-05-04 DIAGNOSIS — Z8669 Personal history of other diseases of the nervous system and sense organs: Secondary | ICD-10-CM

## 2024-05-04 DIAGNOSIS — E1122 Type 2 diabetes mellitus with diabetic chronic kidney disease: Secondary | ICD-10-CM

## 2024-05-04 LAB — POCT URINALYSIS DIP (CLINITEK)
Bilirubin, UA: NEGATIVE
Blood, UA: NEGATIVE
Glucose, UA: 500 mg/dL — AB
Ketones, POC UA: NEGATIVE mg/dL
Leukocytes, UA: NEGATIVE
Nitrite, UA: NEGATIVE
POC PROTEIN,UA: NEGATIVE
Spec Grav, UA: 1.01 (ref 1.010–1.025)
Urobilinogen, UA: 0.2 U/dL
pH, UA: 6 (ref 5.0–8.0)

## 2024-05-04 MED ORDER — AMLODIPINE BESYLATE 10 MG PO TABS: 10.0000 mg | ORAL_TABLET | Freq: Every day | ORAL | 1 refills | Status: AC

## 2024-05-04 MED ORDER — OLMESARTAN MEDOXOMIL 40 MG PO TABS: 40.0000 mg | ORAL_TABLET | Freq: Every day | ORAL | 1 refills | Status: AC

## 2024-05-04 MED ORDER — TOPIRAMATE 25 MG PO TABS: 75.0000 mg | ORAL_TABLET | Freq: Every day | ORAL | 1 refills | Status: AC

## 2024-05-04 NOTE — Progress Notes (Signed)
 [name], CMA,acting as a neurosurgeon for Jane Ada, FNP.,have documented all relevant documentation on the behalf of Jane Ada, FNP,as directed by  Jane Ada, FNP while in the presence of Jane Ada, FNP.  Subjective:    Patient ID: Jane Pena , female    DOB: 1955-07-14 , 68 y.o.   MRN: [id number]  Chief Complaint  Patient presents with   Annual Exam    Patient presents today for HM, Patient reports compliance with medication. Patient denies any chest pain, SOB, or headaches. Patient has no concerns today.     She has [facility] - Dr. Johnnye. She had mammogram, bone density and vaginal exam.   She is planning to see a new eye doctor next week.     Discussed the use of AI scribe software for clinical note transcription with the patient, who gave verbal consent to proceed.  History of Present Illness Jane Pena is a 68 year old female who presents for an annual physical exam.  She has chronic kidney disease, which she attributes to a medication from a previous doctor. She has not had a colonoscopy since 2010.  She experiences cluster headaches, managed with topiramate , taking three tablets nightly. She exercises regularly, engaging in physical activity at least four to five days a week, including walking and indoor workouts. Her dog accompanies her on walks when the weather is favorable.  She experiences occasional ankle swelling, particularly around the sock area, which she attributes to sitting with her feet down. She also reports constipation since stopping metformin , which previously caused diarrhea. She uses stool softeners with limited success and drinks about three bottles of water daily.  She has a history of hidradenitis suppurativa, with painful lesions developing under her arms and between her legs. There is a family history of this condition affecting her mother and children.  She uses a CPAP machine for sleep apnea but reports  poor sleep quality, often waking up at night. She has not followed up with her neurologist for CPAP management in a couple of years.  She monitors her blood sugar twice daily, noting higher readings in the morning. She takes Jardiance with breakfast and attributes some fluctuations to her diet and sleep quality.  She takes amlodipine  and alternates her cholesterol medications daily. She recently received a refill for topiramate  and manages her medications through [facility].  Past Medical History:  Diagnosis Date   Allergy 1979   Cannot eat sour cream.   Breast calcification, right    Chronic kidney disease    Currently under care by [facility]   Diabetes mellitus without complication (HCC)    GERD (gastroesophageal reflux disease)    takes OTC med   Headache disorder    Hyperlipidemia    Hypertension    Sleep apnea 2021   CPAP     Family History  Problem Relation Age of Onset   Hypertension Mother    Dementia Mother    Stroke Mother    Diabetes Father    Hypertension Father    Hypertension Brother    Diabetes Brother    Cancer Maternal Grandmother    Cancer Maternal Grandfather    ADD / ADHD Son    Cancer Son    Kidney disease Son    Miscarriages / Stillbirths Daughter      Current Outpatient Medications:    Blood Glucose Monitoring Suppl (ONETOUCH VERIO FLEX SYSTEM) w/Device KIT, Use daily to check blood  sugars., Disp: 1 kit, Rfl: 0   Coenzyme Q10 (CO Q 10) 100 MG CAPS, Take 1 capsule by mouth daily., Disp: , Rfl:    ezetimibe (ZETIA) 10 MG tablet, Take 10 mg by mouth daily., Disp: , Rfl:    glucose blood (ONETOUCH VERIO) test strip, Use to test blood sugar twice daily, Disp: 200 each, Rfl: 12   JARDIANCE 25 MG TABS tablet, Take 25 mg by mouth daily., Disp: , Rfl:    Lancets (ONETOUCH DELICA PLUS LANCET33G) MISC, Apply 1 each topically daily., Disp: , Rfl:    rosuvastatin (CRESTOR) 5 MG tablet, Take 5 mg by mouth daily., Disp: , Rfl:    vitamin B-12  (CYANOCOBALAMIN) 500 MCG tablet, Take 500 mcg by mouth daily., Disp: , Rfl:    amLODipine  (NORVASC ) 10 MG tablet, Take 1 tablet (10 mg total) by mouth daily., Disp: 90 tablet, Rfl: 1   olmesartan  (BENICAR ) 40 MG tablet, Take 1 tablet (40 mg total) by mouth daily., Disp: 90 tablet, Rfl: 1   topiramate  (TOPAMAX ) 25 MG tablet, Take 3 tablets (75 mg total) by mouth at bedtime., Disp: 90 tablet, Rfl: 1   Allergies[1]    The patient states she uses post menopausal status for birth control. No LMP recorded. Patient is postmenopausal.. Negative for: breast discharge, breast lump(s), breast pain and breast self exam. Associated symptoms include abnormal vaginal bleeding. Pertinent negatives include abnormal bleeding (hematology), anxiety, decreased libido, depression, difficulty falling sleep, dyspareunia, history of infertility, nocturia, sexual dysfunction, sleep disturbances, urinary incontinence, urinary urgency, vaginal discharge and vaginal itching. Diet regular.The patient states her exercise level is moderate 4-5 days week. When it is warmer she will get out and walk for 1-2 miles a day. Now at home she will do exercises at home.   The patient's tobacco use is: Tobacco Use History[2]. She has been exposed to passive smoke. The patient's alcohol use is:  Social History   Substance and Sexual Activity  Alcohol Use Yes   Alcohol/week: 2.0 standard drinks of alcohol   Types: 2 Glasses of wine per week   Comment: 2 glasses of wine weekends    Review of Systems  Constitutional: Negative.   HENT: Negative.    Eyes: Negative.   Respiratory: Negative.    Cardiovascular: Negative.   Gastrointestinal: Negative.   Endocrine: Negative.   Genitourinary: Negative.   Musculoskeletal: Negative.   Skin: Negative.   Allergic/Immunologic: Negative.   Neurological: Negative.   Hematological: Negative.   Psychiatric/Behavioral: Negative.       Today's Vitals   05/04/24 1146  BP: 130/70  Pulse: 100   Temp: 98.8 F (37.1 C)  TempSrc: Oral  Weight: 168 lb (76.2 kg)  Height: 5' 3 (1.6 m)  PainSc: 0-No pain   Body mass index is 29.76 kg/m.  Wt Readings from Last 3 Encounters:  05/04/24 168 lb (76.2 kg)  12/29/23 173 lb (78.5 kg)  10/04/23 172 lb 3.2 oz (78.1 kg)     Objective:  Physical Exam Vitals and nursing note reviewed.  Constitutional:      General: She is not in acute distress.    Appearance: Normal appearance. She is well-developed.  HENT:     Head: Normocephalic and atraumatic.     Right Ear: Hearing, tympanic membrane, ear canal and external ear normal. There is no impacted cerumen.     Left Ear: Hearing, tympanic membrane, ear canal and external ear normal. There is no impacted cerumen.     Nose: Nose normal.  Mouth/Throat:     Mouth: Mucous membranes are moist.  Eyes:     General: Lids are normal.     Extraocular Movements: Extraocular movements intact.     Conjunctiva/sclera: Conjunctivae normal.     Pupils: Pupils are equal, round, and reactive to light.     Funduscopic exam:    Right eye: No papilledema.        Left eye: No papilledema.  Neck:     Thyroid : No thyroid  mass.     Vascular: No carotid bruit.  Cardiovascular:     Rate and Rhythm: Normal rate and regular rhythm.     Pulses: Normal pulses.     Heart sounds: Normal heart sounds. No murmur heard. Pulmonary:     Effort: Pulmonary effort is normal.     Breath sounds: Normal breath sounds.  Chest:     Chest wall: No mass.  Breasts:    Tanner Score is 5.     Right: Normal. No mass or tenderness.     Left: Normal. No mass or tenderness.  Abdominal:     General: Abdomen is flat. Bowel sounds are normal. There is no distension.     Palpations: Abdomen is soft.     Tenderness: There is no abdominal tenderness.  Genitourinary:    Rectum: Guaiac result negative.  Musculoskeletal:        General: No swelling. Normal range of motion.     Cervical back: Full passive range of motion without  pain, normal range of motion and neck supple.     Right lower leg: No edema.     Left lower leg: No edema.  Lymphadenopathy:     Upper Body:     Right upper body: No supraclavicular, axillary or pectoral adenopathy.     Left upper body: No supraclavicular, axillary or pectoral adenopathy.  Skin:    General: Skin is warm and dry.     Capillary Refill: Capillary refill takes less than 2 seconds.  Neurological:     General: No focal deficit present.     Mental Status: She is alert and oriented to person, place, and time.     Cranial Nerves: No cranial nerve deficit.     Sensory: No sensory deficit.  Psychiatric:        Mood and Affect: Mood normal.        Behavior: Behavior normal.        Thought Content: Thought content normal.        Judgment: Judgment normal.         Assessment And Plan:     Encounter for annual health examination Assessment & Plan: Behavior modifications discussed and diet history reviewed.   Pt will continue to exercise regularly and modify diet with low GI, plant based foods and decrease intake of processed foods.  Recommend intake of daily multivitamin, Vitamin D , and calcium .  Recommend mammogram and colonoscopy for preventive screenings, as well as recommend immunizations that include influenza, TDAP, and Shingles    Essential hypertension Assessment & Plan: Blood pressure is controlled.  - Continue Olmesartan  40 MG oral daily and Amlodipine  10 MG oral daily.  Orders: -     POCT URINALYSIS DIP (CLINITEK) -     Microalbumin / creatinine urine ratio -     CMP14+EGFR -     amLODIPine  Besylate; Take 1 tablet (10 mg total) by mouth daily.  Dispense: 90 tablet; Refill: 1 -     Olmesartan  Medoxomil; Take 1 tablet (40 mg  total) by mouth daily.  Dispense: 90 tablet; Refill: 1  Type 2 diabetes mellitus with stage 3a chronic kidney disease, without long-term current use of insulin (HCC) Assessment & Plan: A1c at 7.2 indicates suboptimal control.  -  Continue Jardiance 25 MG oral daily. - Monitor blood glucose levels twice daily. - Schedule follow-up in 4 months to reassess A1c and diabetes management.  Orders: -     CMP14+EGFR -     Hemoglobin A1c  OSA on CPAP Assessment & Plan: Managed with CPAP. Reports occasional awakenings, possible air pressure issues. - Contact [facility] for CPAP follow-up and potential adjustment of air pressure settings.   Mixed hyperlipidemia Assessment & Plan: Managed with Rosuvastatin and Ezetimibe on alternating days. - Continue Rosuvastatin 5 MG oral daily and Ezetimibe 10 MG oral daily, alternating days.  Orders: -     CMP14+EGFR -     Lipid panel  Overweight with body mass index (BMI) of 29 to 29.9 in adult  Other long term (current) drug therapy -     CBC with Differential/Platelet  History of cluster headache -     Topiramate ; Take 3 tablets (75 mg total) by mouth at bedtime.  Dispense: 90 tablet; Refill: 1  Hidradenitis suppurativa Assessment & Plan: Recurrent lesions under arms and between legs. Family history noted. - Consider using luffa sponges for exfoliation to reduce lesion formation.   Chronic idiopathic constipation Assessment & Plan: Intermittent constipation improved since stopping Metformin . Stool softeners provide some relief. - Consider using Miralax if constipation persists. - Ensure adequate hydration   Chronic cluster headache, not intractable Assessment & Plan: Managed with Topiramate , effective in preventing headaches. None currently - Continue Topiramate  75 MG oral at bedtime.      Return for 1 year physical, controlled DM check 4 months. Patient was given opportunity to ask questions. Patient verbalized understanding of the plan and was able to repeat key elements of the plan. All questions were answered to their satisfaction.   Jane Ada, FNP  I, Jane Ada, FNP, have reviewed all documentation for this visit. The documentation on  05/04/2024 for the exam, diagnosis, procedures, and orders are all accurate and complete.      [1]  Allergies Allergen Reactions   Other Other (See Comments)    Sour cream   Glimepiride  Palpitations   Tamoxifen Palpitations  [2]  Social History Tobacco Use  Smoking Status Former   Current packs/day: 0.00   Types: Cigarettes   Start date: 05/18/1974   Quit date: 05/18/1984   Years since quitting: 40.0   Passive exposure: Never  Smokeless Tobacco Never

## 2024-05-04 NOTE — Patient Instructions (Signed)
 Call [facility] to schedule an appt to evaluate your sleep apnea - [phone]  Health Maintenance  Topic Date Due   Osteoporosis screening with Bone Density Scan  Never done   Colon Cancer Screening  12/22/2018   Eye exam for diabetics  03/01/2021   COVID-19 Vaccine (3 - Pfizer risk series) 05/19/2024*   Zoster (Shingles) Vaccine (1 of 2) 08/01/2024*   Flu Shot  08/15/2024*   DTaP/Tdap/Td vaccine (2 - Td or Tdap) 12/28/2024*   Pneumococcal Vaccine for age over 65 (1 of 2 - PCV) 12/28/2024*   Hemoglobin A1C  06/30/2024   Yearly kidney health urinalysis for diabetes  10/03/2024   Complete foot exam   10/03/2024   Yearly kidney function blood test for diabetes  11/21/2024   Breast Cancer Screening  03/23/2025   Medicare Annual Wellness Visit  05/03/2025   Hepatitis C Screening  Completed   Meningitis B Vaccine  Aged Out  *Topic was postponed. The date shown is not the original due date.                           Contains text generated by [employer].

## 2024-05-05 LAB — CMP14+EGFR
ALT: 27 IU/L (ref 0–32)
AST: 27 IU/L (ref 0–40)
Albumin: 4.8 g/dL (ref 3.9–4.9)
Alkaline Phosphatase: 95 IU/L (ref 49–135)
BUN/Creatinine Ratio: 16 (ref 12–28)
BUN: 22 mg/dL (ref 8–27)
Bilirubin Total: 0.3 mg/dL (ref 0.0–1.2)
CO2: 23 mmol/L (ref 20–29)
Calcium: 10.5 mg/dL — ABNORMAL HIGH (ref 8.7–10.3)
Chloride: 104 mmol/L (ref 96–106)
Creatinine, Ser: 1.38 mg/dL — ABNORMAL HIGH (ref 0.57–1.00)
Globulin, Total: 2.6 g/dL (ref 1.5–4.5)
Glucose: 127 mg/dL — ABNORMAL HIGH (ref 70–99)
Potassium: 4.4 mmol/L (ref 3.5–5.2)
Sodium: 140 mmol/L (ref 134–144)
Total Protein: 7.4 g/dL (ref 6.0–8.5)
eGFR: 41 mL/min/1.73 — ABNORMAL LOW

## 2024-05-05 LAB — MICROALBUMIN / CREATININE URINE RATIO
Creatinine, Urine: 36.3 mg/dL
Microalb/Creat Ratio: 26 mg/g{creat} (ref 0–29)
Microalbumin, Urine: 9.3 ug/mL

## 2024-05-05 LAB — CBC WITH DIFFERENTIAL/PLATELET
Basophils Absolute: 0.1 x10E3/uL (ref 0.0–0.2)
Basos: 1 %
EOS (ABSOLUTE): 0.1 x10E3/uL (ref 0.0–0.4)
Eos: 2 %
Hematocrit: 49.2 % — ABNORMAL HIGH (ref 34.0–46.6)
Hemoglobin: 16.4 g/dL — ABNORMAL HIGH (ref 11.1–15.9)
Immature Grans (Abs): 0 x10E3/uL (ref 0.0–0.1)
Immature Granulocytes: 0 %
Lymphocytes Absolute: 1.6 x10E3/uL (ref 0.7–3.1)
Lymphs: 28 %
MCH: 30.7 pg (ref 26.6–33.0)
MCHC: 33.3 g/dL (ref 31.5–35.7)
MCV: 92 fL (ref 79–97)
Monocytes Absolute: 0.3 x10E3/uL (ref 0.1–0.9)
Monocytes: 6 %
Neutrophils Absolute: 3.6 x10E3/uL (ref 1.4–7.0)
Neutrophils: 63 %
Platelets: 213 x10E3/uL (ref 150–450)
RBC: 5.34 x10E6/uL — ABNORMAL HIGH (ref 3.77–5.28)
RDW: 13.2 % (ref 11.7–15.4)
WBC: 5.8 x10E3/uL (ref 3.4–10.8)

## 2024-05-05 LAB — HEMOGLOBIN A1C
Est. average glucose Bld gHb Est-mCnc: 160 mg/dL
Hgb A1c MFr Bld: 7.2 % — ABNORMAL HIGH (ref 4.8–5.6)

## 2024-05-05 LAB — LIPID PANEL
Chol/HDL Ratio: 2.6 ratio (ref 0.0–4.4)
Cholesterol, Total: 174 mg/dL (ref 100–199)
HDL: 67 mg/dL
LDL Chol Calc (NIH): 89 mg/dL (ref 0–99)
Triglycerides: 102 mg/dL (ref 0–149)
VLDL Cholesterol Cal: 18 mg/dL (ref 5–40)

## 2024-05-14 ENCOUNTER — Ambulatory Visit: Payer: Self-pay | Admitting: Nurse Practitioner

## 2024-05-14 DIAGNOSIS — L732 Hidradenitis suppurativa: Secondary | ICD-10-CM | POA: Insufficient documentation

## 2024-05-14 DIAGNOSIS — Z0000 Encounter for general adult medical examination without abnormal findings: Secondary | ICD-10-CM | POA: Insufficient documentation

## 2024-05-14 DIAGNOSIS — K5904 Chronic idiopathic constipation: Secondary | ICD-10-CM | POA: Insufficient documentation

## 2024-05-14 NOTE — Assessment & Plan Note (Signed)
 Recurrent lesions under arms and between legs. Family history noted. - Consider using luffa sponges for exfoliation to reduce lesion formation.

## 2024-05-14 NOTE — Assessment & Plan Note (Signed)
 A1c at 7.2 indicates suboptimal control.  - Continue Jardiance 25 MG oral daily. - Monitor blood glucose levels twice daily. - Schedule follow-up in 4 months to reassess A1c and diabetes management.

## 2024-05-14 NOTE — Assessment & Plan Note (Signed)
 Managed with Topiramate , effective in preventing headaches. None currently - Continue Topiramate  75 MG oral at bedtime.

## 2024-05-14 NOTE — Assessment & Plan Note (Signed)
 Managed with Rosuvastatin and Ezetimibe on alternating days. - Continue Rosuvastatin 5 MG oral daily and Ezetimibe 10 MG oral daily, alternating days.

## 2024-05-14 NOTE — Assessment & Plan Note (Signed)

## 2024-05-14 NOTE — Assessment & Plan Note (Signed)
 Blood pressure is controlled.  - Continue Olmesartan  40 MG oral daily and Amlodipine  10 MG oral daily.

## 2024-05-14 NOTE — Assessment & Plan Note (Signed)
 Intermittent constipation improved since stopping Metformin . Stool softeners provide some relief. - Consider using Miralax if constipation persists. - Ensure adequate hydration

## 2024-05-14 NOTE — Assessment & Plan Note (Signed)
 Managed with CPAP. Reports occasional awakenings, possible air pressure issues. - Contact [facility] for CPAP follow-up and potential adjustment of air pressure settings.

## 2024-05-23 ENCOUNTER — Other Ambulatory Visit: Payer: Self-pay | Admitting: Nurse Practitioner

## 2024-05-23 MED ORDER — EZETIMIBE 10 MG PO TABS: ORAL_TABLET | ORAL | Status: AC

## 2024-05-24 ENCOUNTER — Encounter: Payer: Self-pay | Admitting: Pharmacist

## 2024-05-24 NOTE — Progress Notes (Signed)
" ° °  05/24/2024 Name: Jane Pena MRN: [id number] DOB: [birth year]  Chief Complaint  Patient presents with   Medication Assistance    Jardiance     Patient Assistance application completed and provider signature obtained for Jardiance ([employer]). Application was faxed to Luke Mall, CPhT for scanning and processing.  Lab Results  Component Value Date   HGBA1C 7.2 (H) 05/04/2024   HGBA1C 7.1 (H) 12/29/2023   HGBA1C 7.2 (H) 10/04/2023   Cassius DOROTHA Brought, PharmD, BCACP Clinical Pharmacist [phone]  "

## 2024-05-25 NOTE — Telephone Encounter (Signed)
 PAP: Application for Bernadine has been submitted to [employer]), via fax

## 2024-06-02 NOTE — Telephone Encounter (Signed)
 [facility] requested POI for PAP application Jardiance- Patient provided document forwarded to [facility] for review.

## 2024-06-13 NOTE — Telephone Encounter (Signed)
 PAP: Patient assistance application for Jardiance has been approved by PAP Companies: [employer] from 06/13/2024 to 05/17/2025. Medication should be delivered to PAP Delivery: Home. For further shipping updates, please contact [employer] ([employer]) at [phone]. Patient ID is: [id number].

## 2024-09-04 ENCOUNTER — Ambulatory Visit: Payer: Self-pay | Admitting: Nurse Practitioner

## 2024-10-05 ENCOUNTER — Ambulatory Visit: Payer: Medicare (Managed Care) | Admitting: Adult Health

## 2025-05-04 ENCOUNTER — Ambulatory Visit: Payer: Self-pay

## 2025-05-09 ENCOUNTER — Encounter: Payer: Self-pay | Admitting: Nurse Practitioner
